# Patient Record
Sex: Male | Born: 1985 | Race: White | Hispanic: No | Marital: Single | Smoking: Former smoker
Health system: Southern US, Community
[De-identification: ages and names within clinical notes are randomized; demographics above are authoritative.]

## PROBLEM LIST (undated history)

## (undated) DIAGNOSIS — F419 Anxiety disorder, unspecified: Secondary | ICD-10-CM

## (undated) DIAGNOSIS — M7918 Myalgia, other site: Secondary | ICD-10-CM

## (undated) HISTORY — DX: Myalgia, other site: M79.18

## (undated) HISTORY — PX: NO PAST SURGERIES: SHX2092

---

## 2013-12-19 ENCOUNTER — Other Ambulatory Visit: Payer: Self-pay | Admitting: Occupational Medicine

## 2013-12-19 ENCOUNTER — Ambulatory Visit: Payer: Self-pay

## 2013-12-19 DIAGNOSIS — Z0000 Encounter for general adult medical examination without abnormal findings: Secondary | ICD-10-CM

## 2014-09-26 ENCOUNTER — Ambulatory Visit: Payer: Self-pay

## 2014-09-26 ENCOUNTER — Other Ambulatory Visit: Payer: Self-pay | Admitting: Occupational Medicine

## 2014-09-26 DIAGNOSIS — Z0000 Encounter for general adult medical examination without abnormal findings: Secondary | ICD-10-CM

## 2015-01-25 IMAGING — CR DG CHEST 2V
2 series · 2 of 2 positions shown · non-contrast
Comparison: 12/19/2013

CLINICAL DATA: Physical exam.

EXAM:
CHEST  2 VIEW

[view not recorded (1 of 2)]
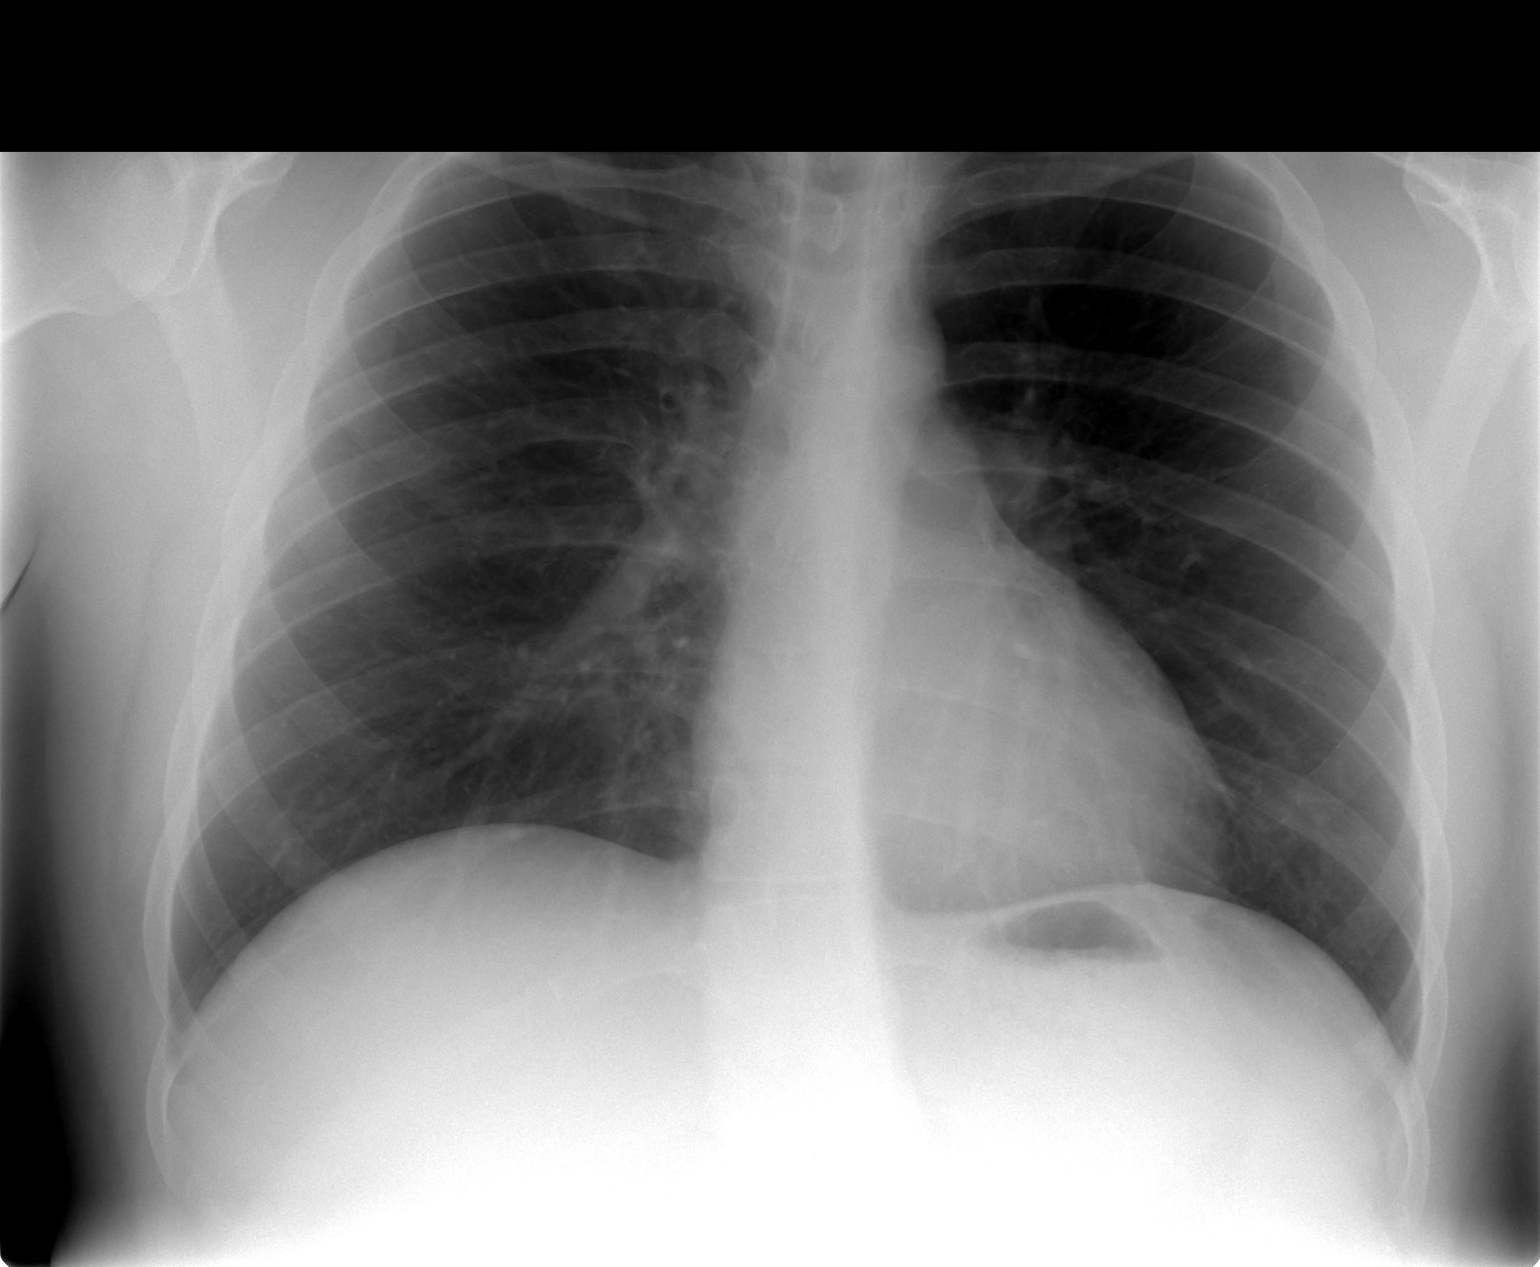

[view not recorded (2 of 2)]
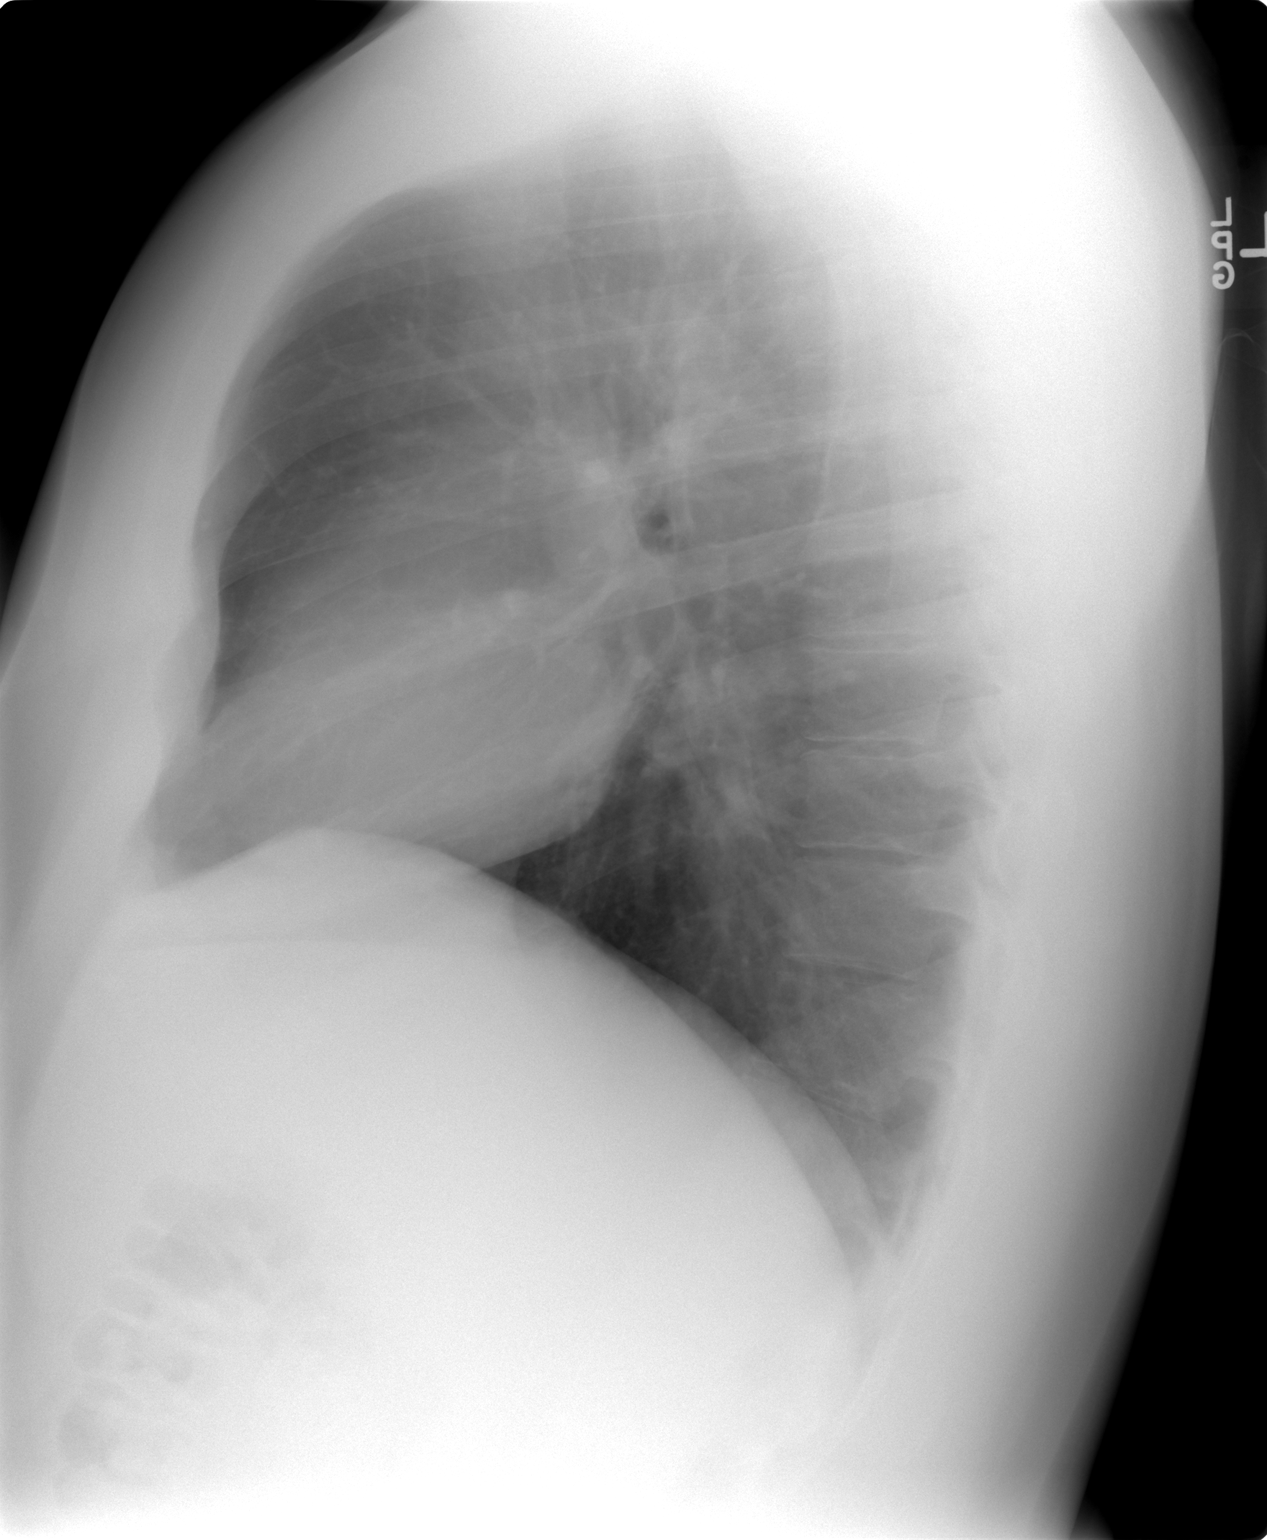

[2 of 2 positions shown; findings below may reference images not displayed]

FINDINGS: The heart size and mediastinal contours are within normal limits.
Both lungs are clear. The visualized skeletal structures are
unremarkable.
IMPRESSION: No active cardiopulmonary disease.

## 2016-12-06 ENCOUNTER — Ambulatory Visit (INDEPENDENT_AMBULATORY_CARE_PROVIDER_SITE_OTHER): Payer: 59 | Admitting: Family

## 2016-12-06 ENCOUNTER — Encounter: Payer: Self-pay | Admitting: Family

## 2016-12-06 ENCOUNTER — Other Ambulatory Visit: Payer: 59

## 2016-12-06 DIAGNOSIS — Z711 Person with feared health complaint in whom no diagnosis is made: Secondary | ICD-10-CM | POA: Diagnosis not present

## 2016-12-06 DIAGNOSIS — L918 Other hypertrophic disorders of the skin: Secondary | ICD-10-CM | POA: Diagnosis not present

## 2016-12-06 NOTE — Patient Instructions (Signed)
Thank you for choosing [facility].  SUMMARY AND INSTRUCTIONS:  Please continue to watch for signs of infection.  Cleanse with soap and water. No peroxide or alcohol.  Tylenol as needed for discomfort.   Labs:  Please stop by the lab on the lower level of the building for your blood work. Your results will be released to [place] (or called to you) after review, usually within 72 hours after test completion. If any changes need to be made, you will be notified at that same time.  1.) The lab is open from 7:30am to 5:30 pm Monday-Friday 2.) No appointment is necessary 3.) Fasting (if needed) is 6-8 hours after food and drink; black coffee and water are okay   Follow up:  If your symptoms worsen or fail to improve, please contact our office for further instruction, or in case of emergency go directly to the emergency room at the closest medical facility.    Skin Tag, Adult A skin tag (acrochordon) is a soft, extra growth of skin. Most skin tags are flesh-colored and rarely bigger than a pencil eraser. They commonly form near areas where there are folds in the skin, such as the armpit or groin. Skin tags are not dangerous, and they do not spread from person to person (are not contagious). You may have one skin tag or several. Skin tags do not require treatment. However, your health care provider may recommend removal of a skin tag if it:  Gets irritated from clothing.  Bleeds.  Is visible and unsightly. Your health care provider can remove skin tags with a simple surgical procedure or a procedure that involves freezing the skin tag. Follow these instructions at home:  Watch for any changes in your skin tag. A normal skin tag does not require any other special care at home.  Take over-the-counter and prescription medicines only as told by your health care provider.  Keep all follow-up visits as told by your health care provider. This is important. Contact a health care  provider if:  You have a skin tag that:  Becomes painful.  Changes color.  Bleeds.  Swells.  You develop more skin tags. This information is not intended to replace advice given to you by your health care provider. Make sure you discuss any questions you have with your health care provider. Document Released: 10/11/2015 Document Revised: 05/22/2016 Document Reviewed: 10/11/2015 Elsevier Interactive Patient Education  2017 [employer].

## 2016-12-06 NOTE — Assessment & Plan Note (Signed)
Multiple skin tags removed without complication. Continue with basic wound care and monitor for signs of infection. Follow up as needed.

## 2016-12-06 NOTE — Progress Notes (Signed)
Subjective:    Patient ID: Justin Andrade, male    DOB: [birth year], 31 y.o.   MRN: [id number]  Chief Complaint  Patient presents with  . Establish Care    skin tag removal if possible, wants STD screening    HPI:  Justin Andrade is a 31 y.o. male who  has no past medical history on file. and presents today for an office visit to establish care.   1.) Skin tag - This is a new problem. Associated symptom of a skin tag located on his back and left buttocks that have been going on for a couple of years. Denies any changes in size or shape. Described as annoying. Nontender with no discharge. Denies any modifying factors or attempted treatments. Would like to have them removed.   2.) Concern for STD: Requesting to be screened for STD. Does have new relationship with little concern for exposure. No current symptoms of discharge, dysuria, or rashes.   Allergies  Allergen Reactions  . Sulfa Antibiotics     Had an allergic reaction as a child. Does not remeber      No outpatient prescriptions prior to visit.   No facility-administered medications prior to visit.       History reviewed. No pertinent surgical history.    History reviewed. No pertinent past medical history.    Review of Systems  Constitutional: Negative for chills and fever.  Respiratory: Negative for chest tightness and shortness of breath.   Genitourinary: Negative for discharge, dysuria, frequency, hematuria, penile pain, penile swelling, scrotal swelling, testicular pain and urgency.  Skin:       Positive for skin tags.       Objective:    BP 134/90 (BP Location: Left Arm, Patient Position: Sitting, Cuff Size: Large)   Pulse 64   Temp 98.3 F (36.8 C) (Oral)   Resp 14   Ht 6\' 1"  (1.854 m)   Wt 227 lb (103 kg)   SpO2 98%   BMI 29.95 kg/m  Nursing note and vital signs reviewed.  Physical Exam  Constitutional: He is oriented to person, place, and time. He appears well-developed and well-nourished.  No distress.  Cardiovascular: Normal rate, regular rhythm, normal heart sounds and intact distal pulses.   Pulmonary/Chest: Effort normal and breath sounds normal.  Neurological: He is alert and oriented to person, place, and time.  Skin: Skin is warm and dry.  Two skin tags noted - one on the left side of his mid back and the other on the left side of his left buttock.   Psychiatric: He has a normal mood and affect. His behavior is normal. Judgment and thought content normal.   Procedure: Removal of skin tags  Risks and alternative treatments discussed. Patient provided verbal consent. The sites were identified and marked. Each site was independently cleansed with betadine and allowed to dry. Skin anesthesia was applied using a cold spray. Site was anesthetized with 2% lidocaine. Each skin tag was elevated with forceps and then lanced at the base using a scalpel. Site was cleansed with normal saline with bleeding well controlled with dressing and bandage. Procedure was uncomplicated and tolerated well. Post-care instructions were provided.     Assessment & Plan:   Problem List Items Addressed This Visit      Musculoskeletal and Integument   Skin tag    Multiple skin tags removed without complication. Continue with basic wound care and monitor for signs of infection. Follow up as needed.  Other   Concern about STD in male without diagnosis    Not currently experiencing symptoms. Obtain RPR, HSV 2, HSV 1, HIV and GC/chlamydia probe. Encouraged good sexual protection and practices. Follow up and treatment pending lab results as needed.       Relevant Orders   HIV antibody   HSV 1 antibody, IgG   HSV 2 antibody, IgG   RPR   GC/chlamydia probe amp, urine      Mr. Jansma does not currently have medications on file.   Follow-up: Return if symptoms worsen or fail to improve.  Mauricio Po, FNP

## 2016-12-06 NOTE — Assessment & Plan Note (Signed)
Not currently experiencing symptoms. Obtain RPR, HSV 2, HSV 1, HIV and GC/chlamydia probe. Encouraged good sexual protection and practices. Follow up and treatment pending lab results as needed.

## 2016-12-07 LAB — HSV 2 ANTIBODY, IGG

## 2016-12-07 LAB — GC/CHLAMYDIA PROBE AMP
CT PROBE, AMP APTIMA: NOT DETECTED
GC PROBE AMP APTIMA: NOT DETECTED

## 2016-12-07 LAB — HIV ANTIBODY (ROUTINE TESTING W REFLEX): HIV 1&2 Ab, 4th Generation: NONREACTIVE

## 2016-12-07 LAB — RPR

## 2016-12-07 LAB — HSV 1 ANTIBODY, IGG

## 2017-03-03 ENCOUNTER — Ambulatory Visit (INDEPENDENT_AMBULATORY_CARE_PROVIDER_SITE_OTHER): Payer: 59 | Admitting: Internal Medicine

## 2017-03-03 ENCOUNTER — Encounter: Payer: Self-pay | Admitting: Internal Medicine

## 2017-03-03 VITALS — BP 114/84 | HR 71 | Temp 97.7°F | Resp 16 | Wt 226.0 lb

## 2017-03-03 DIAGNOSIS — J029 Acute pharyngitis, unspecified: Secondary | ICD-10-CM | POA: Diagnosis not present

## 2017-03-03 LAB — POCT RAPID STREP A (OFFICE): RAPID STREP A SCREEN: NEGATIVE

## 2017-03-03 NOTE — Progress Notes (Signed)
    Subjective:    Patient ID: Justin Andrade, male    DOB: 1985/12/01, 31 y.o.   MRN: [id number]  HPI He is here for an acute visit for cold symptoms.  His symptoms started yesterday.  He denies sick contacts.  He had strep a few years ago.   He is experiencing sore throat and runny nose.  He has had mild ear pain/pressure and think his ears may be clogged.  He denies fever, chills, congestion, sinus pain, cough, sob, abdominal symptoms, headaches and muscle pain.  He has tried taking advil.     Medications and allergies reviewed with patient and updated if appropriate.  Patient Active Problem List   Diagnosis Date Noted  . Concern about STD in male without diagnosis 12/06/2016  . Skin tag 12/06/2016    No current outpatient prescriptions on file prior to visit.   No current facility-administered medications on file prior to visit.     No past medical history on file.  No past surgical history on file.  Social History   Social History  . Marital status: Single    Spouse name: N/A  . Number of children: 0  . Years of education: 62   Occupational History  . [employer]    Social History Main Topics  . Smoking status: Former Smoker    Packs/day: 0.50    Years: 5.00    Types: Cigarettes  . Smokeless tobacco: Never Used  . Alcohol use 2.4 oz/week    4 Cans of beer per week  . Drug use: No  . Sexual activity: Not on file   Other Topics Concern  . Not on file   Social History Narrative   Fun/Hobby: Dodgeball, treadmill, play video games    Family History  Problem Relation Age of Onset  . Hypertension Mother   . Depression Father     Review of Systems  Constitutional: Negative for appetite change, chills and fever.  HENT: Positive for ear pain (mild, ? clogged), rhinorrhea and sore throat. Negative for congestion, sinus pain and sinus pressure.   Respiratory: Negative for cough, shortness of breath and wheezing.   Gastrointestinal: Negative for abdominal  pain, diarrhea and nausea.  Musculoskeletal: Negative for myalgias.  Neurological: Negative for dizziness, light-headedness and headaches.       Objective:   Vitals:   03/03/17 1014  BP: 114/84  Pulse: 71  Resp: 16  Temp: 97.7 F (36.5 C)   Filed Weights   03/03/17 1014  Weight: 226 lb (102.5 kg)   Body mass index is 29.82 kg/m.  Wt Readings from Last 3 Encounters:  03/03/17 226 lb (102.5 kg)  12/06/16 227 lb (103 kg)     Physical Exam GENERAL APPEARANCE: Appears stated age, well appearing, NAD EYES: conjunctiva clear, no icterus HEENT: bilateral tympanic membranes and ear canals normal, oropharynx with moderate erythema, no thyromegaly, trachea midline, no cervical or supraclavicular lymphadenopathy LUNGS: Clear to auscultation without wheeze or crackles, unlabored breathing, good air entry bilaterally HEART: Normal S1,S2 without murmurs EXTREMITIES: Without clubbing, cyanosis, or edema        Assessment & Plan:   See Problem List for Assessment and Plan of chronic medical problems.

## 2017-03-03 NOTE — Assessment & Plan Note (Signed)
Rapid strep negative - likely viral Advil, rest, fluids Chloraseptic, cough drops  Call if no improvement

## 2017-03-03 NOTE — Addendum Note (Signed)
Addended by: Terence Lux B on: 03/03/2017 10:45 AM   Modules accepted: Orders

## 2017-03-03 NOTE — Patient Instructions (Addendum)
Your strep test is negative  Continue advil, rest, fluids.  Use cough drops or chloraseptic spray for your throat.   Pharyngitis Pharyngitis is redness, pain, and swelling (inflammation) of your pharynx. What are the causes? Pharyngitis is usually caused by infection. Most of the time, these infections are from viruses (viral) and are part of a cold. However, sometimes pharyngitis is caused by bacteria (bacterial). Pharyngitis can also be caused by allergies. Viral pharyngitis may be spread from person to person by coughing, sneezing, and personal items or utensils (cups, forks, spoons, toothbrushes). Bacterial pharyngitis may be spread from person to person by more intimate contact, such as kissing. What are the signs or symptoms? Symptoms of pharyngitis include:  Sore throat.  Tiredness (fatigue).  Low-grade fever.  Headache.  Joint pain and muscle aches.  Skin rashes.  Swollen lymph nodes.  Plaque-like film on throat or tonsils (often seen with bacterial pharyngitis). How is this diagnosed? Your health care provider will ask you questions about your illness and your symptoms. Your medical history, along with a physical exam, is often all that is needed to diagnose pharyngitis. Sometimes, a rapid strep test is done. Other lab tests may also be done, depending on the suspected cause. How is this treated? Viral pharyngitis will usually get better in 3-4 days without the use of medicine. Bacterial pharyngitis is treated with medicines that kill germs (antibiotics). Follow these instructions at home:  Drink enough water and fluids to keep your urine clear or pale yellow.  Only take over-the-counter or prescription medicines as directed by your health care provider:  If you are prescribed antibiotics, make sure you finish them even if you start to feel better.  Do not take aspirin.  Get lots of rest.  Gargle with 8 oz of salt water ( tsp of salt per 1 qt of water) as often as  every 1-2 hours to soothe your throat.  Throat lozenges (if you are not at risk for choking) or sprays may be used to soothe your throat. Contact a health care provider if:  You have large, tender lumps in your neck.  You have a rash.  You cough up green, yellow-brown, or bloody spit. Get help right away if:  Your neck becomes stiff.  You drool or are unable to swallow liquids.  You vomit or are unable to keep medicines or liquids down.  You have severe pain that does not go away with the use of recommended medicines.  You have trouble breathing (not caused by a stuffy nose). This information is not intended to replace advice given to you by your health care provider. Make sure you discuss any questions you have with your health care provider. Document Released: 09/26/2005 Document Revised: 03/03/2016 Document Reviewed: 06/03/2013 Elsevier Interactive Patient Education  2017 [employer].

## 2017-04-06 ENCOUNTER — Ambulatory Visit (INDEPENDENT_AMBULATORY_CARE_PROVIDER_SITE_OTHER): Payer: 59 | Admitting: Internal Medicine

## 2017-04-06 ENCOUNTER — Ambulatory Visit: Payer: 59 | Admitting: Family

## 2017-04-06 ENCOUNTER — Encounter: Payer: Self-pay | Admitting: Internal Medicine

## 2017-04-06 VITALS — BP 132/90 | HR 64 | Ht 73.0 in | Wt 226.0 lb

## 2017-04-06 DIAGNOSIS — M25511 Pain in right shoulder: Secondary | ICD-10-CM | POA: Diagnosis not present

## 2017-04-06 DIAGNOSIS — G8929 Other chronic pain: Secondary | ICD-10-CM | POA: Diagnosis not present

## 2017-04-06 MED ORDER — TRAMADOL HCL 50 MG PO TABS: 50.0000 mg | ORAL_TABLET | Freq: Two times a day (BID) | ORAL | 0 refills | Status: DC | PRN

## 2017-04-06 NOTE — Progress Notes (Signed)
   Subjective:    Patient ID: Justin Andrade, male    DOB: December 25, 1985, 31 y.o.   MRN: [id number]  HPI  Here to f/u with c/o onset x 2 mo right shoulder pain and decreased ROM, feels he may have partial tear again of the rot cuff that he has had 10 yrs ago with playing baseball.  Then was improved with PT, no surgury needed.  No recent overuse, trauma or heavy lifing but feels similar, without neck pain or other more distal arm pain, swelling or rash.  Asks for pain med for sleep as cannot sleep well in the past week No past medical history on file. No past surgical history on file.  reports that he has quit smoking. His smoking use included Cigarettes. He has a 2.50 pack-year smoking history. He has never used smokeless tobacco. He reports that he drinks about 2.4 oz of alcohol per week . He reports that he does not use drugs. family history includes Depression in his father; Hypertension in his mother. Allergies  Allergen Reactions  . Sulfa Antibiotics     Had an allergic reaction as a child. Does not remeber   No current outpatient prescriptions on file prior to visit.   No current facility-administered medications on file prior to visit.    Review of Systems  Constitutional: Negative for other unusual diaphoresis or sweats HENT: Negative for ear discharge or swelling Eyes: Negative for other worsening visual disturbances Respiratory: Negative for stridor or other swelling  Gastrointestinal: Negative for worsening distension or other blood Genitourinary: Negative for retention or other urinary change Musculoskeletal: Negative for other MSK pain or swelling Skin: Negative for color change or other new lesions Neurological: Negative for worsening tremors and other numbness  Psychiatric/Behavioral: Negative for worsening agitation or other fatigue All other system neg per pt    Objective:   Physical Exam BP 132/90   Pulse 64   Ht 6\' 1"  (1.854 m)   Wt 226 lb (102.5 kg)   SpO2 99%    BMI 29.82 kg/m  VS noted, not ill appearing Constitutional: Pt appears in NAD HENT: Head: NCAT.  Right Ear: External ear normal.  Left Ear: External ear normal.  Eyes: . Pupils are equal, round, and reactive to light. Conjunctivae and EOM are normal Nose: without d/c or deformity Neck: Neck supple. Gross normal ROM Cardiovascular: Normal rate and regular rhythm.   Pulmonary/Chest: Effort normal and breath sounds without rales or wheezing.  Right shoulder NT but pain and decraased ROM to forward elevation and abduction to just over 90 degrees Neurological: Pt is alert. At baseline orientation, motor 5/5 intact Skin: Skin is warm. No rashes, other new lesions, no LE edema Psychiatric: Pt behavior is normal without agitation  No other exam findings     Assessment & Plan:

## 2017-04-06 NOTE — Patient Instructions (Signed)
Please take all new medication as prescribed - the pain medication as needed  Please continue all other medications as before, and refills have been done if requested.  Please have the pharmacy call with any other refills you may need.  Please keep your appointments with your specialists as you may have planned  You will be contacted regarding the referral for: Dr Smith/sports medicine in this office, (though you can make an appt as you leave at the checkout desk today)

## 2017-04-09 NOTE — Assessment & Plan Note (Signed)
I suspect pt may be correct regarding the recurrence right shoulder at least partial rot cuff injury; for pain med today as nsaid have not helped, and Refer Sports Med in this office for further consideration

## 2017-05-01 NOTE — Progress Notes (Signed)
[facility] [address] [place], [place] [address] Phone: [phone] Subjective:    I'm seeing this patient by the request  of:  Golden Circle, FNP   CC: Right shoulder pain   EXN:[id number]  Justin Andrade is a 31 y.o. male coming in with complaint of Right shoulder pain. History of a dislocation when he was 19. Unfortunately had another 5-6 months ago had what appeared to be another dislocation.  Signs surgery. Patient states that with his last injury has noticed that is having some decreasing range of motion. Sometimes feels like his shoulder can come out of place. Pain at night. Denies any radiation down the arm. Describes most the pain as an aching sensation. Aggravated once again by overhead movements as well as certain positioning. Has done icing and over-the-counter medication with very minimal benefit. Rates the severity is 6 out of 10.     No past medical history on file. No past surgical history on file. Social History   Social History  . Marital status: Single    Spouse name: N/A  . Number of children: 0  . Years of education: 73   Occupational History  . [employer]    Social History Main Topics  . Smoking status: Former Smoker    Packs/day: 0.50    Years: 5.00    Types: Cigarettes  . Smokeless tobacco: Never Used  . Alcohol use 2.4 oz/week    4 Cans of beer per week  . Drug use: No  . Sexual activity: Not Asked   Other Topics Concern  . None   Social History Narrative   Fun/Hobby: Dodgeball, treadmill, play video games   Allergies  Allergen Reactions  . Sulfa Antibiotics     Had an allergic reaction as a child. Does not remeber   Family History  Problem Relation Age of Onset  . Hypertension Mother   . Depression Father      Past medical history, social, surgical and family history all reviewed in electronic medical record.  No pertanent information unless stated regarding to the chief complaint.   Review of  Systems:Review of systems updated and as accurate as of 05/02/17  No headache, visual changes, nausea, vomiting, diarrhea, constipation, dizziness, abdominal pain, skin rash, fevers, chills, night sweats, weight loss, swollen lymph nodes, body aches, joint swelling, muscle aches, chest pain, shortness of breath, mood changes.   Objective  Blood pressure 110/76, pulse 65, height 6\' 1"  (1.854 m), weight 224 lb (101.6 kg), SpO2 97 %. Systems examined below as of 05/02/17   General: No apparent distress alert and oriented x3 mood and affect normal, dressed appropriately.  HEENT: Pupils equal, extraocular movements intact  Respiratory: Patient's speak in full sentences and does not appear short of breath  Cardiovascular: No lower extremity edema, non tender, no erythema  Skin: Warm dry intact with no signs of infection or rash on extremities or on axial skeleton.  Abdomen: Soft nontender  Neuro: Cranial nerves II through XII are intact, neurovascularly intact in all extremities with 2+ DTRs and 2+ pulses.  Lymph: No lymphadenopathy of posterior or anterior cervical chain or axillae bilaterally.  Gait normal with good balance and coordination.  MSK:  Non tender with full range of motion and good stability and symmetric strength and tone of , elbows, wrist, hip, knee and ankles bilaterally.  Shoulder: Right Inspection reveals no abnormalities, atrophy or asymmetry. Palpation is normal with no tenderness over AC joint or bicipital  groove. ROM is full in all planes passively. Rotator cuff strength normal throughout. signs of impingement with positive Neer and Hawkin's tests, but negative empty can sign. Speeds and Yergason's tests normal. No labral pathology noted with negative Obrien's, negative clunk and good stability. Normal scapular function observed. No painful arc and no drop arm sign. Positive apprehension sign and does have some mild laxity  MSK US performed of: Right This study was  ordered, performed, and interpreted by Charlann Boxer D.O.  Shoulder:   Supraspinatus:  Appears normal on long and transverse views, Bursal bulge seen with shoulder abduction on impingement view. Infraspinatus:  Appears normal on long and transverse views. Significant increase in Doppler flow Subscapularis:  Appears normal on long and transverse views. Positive bursa Teres Minor:  Appears normal on long and transverse views. AC joint:  Capsule undistended, no geyser sign. Glenohumeral Joint:  Appears normal without effusion. Glenoid Labrum:  Intact without visualized tears. Biceps Tendon:  Appears normal on long and transverse views, no fraying of tendon, tendon located in intertubercular groove, no subluxation with shoulder internal or external rotation.  Impression: Subacromial bursitis  fairly unremarkable    Impression and Recommendations:     This case required medical decision making of moderate complexity.      Note: This dictation was prepared with Dragon dictation along with smaller phrase technology. Any transcriptional errors that result from this process are unintentional.

## 2017-05-02 ENCOUNTER — Ambulatory Visit: Payer: Self-pay

## 2017-05-02 ENCOUNTER — Ambulatory Visit (INDEPENDENT_AMBULATORY_CARE_PROVIDER_SITE_OTHER): Payer: 59 | Admitting: Family Medicine

## 2017-05-02 ENCOUNTER — Encounter: Payer: Self-pay | Admitting: Family Medicine

## 2017-05-02 VITALS — BP 110/76 | HR 65 | Ht 73.0 in | Wt 224.0 lb

## 2017-05-02 DIAGNOSIS — M7551 Bursitis of right shoulder: Secondary | ICD-10-CM

## 2017-05-02 DIAGNOSIS — Z8739 Personal history of other diseases of the musculoskeletal system and connective tissue: Secondary | ICD-10-CM | POA: Diagnosis not present

## 2017-05-02 DIAGNOSIS — M25511 Pain in right shoulder: Secondary | ICD-10-CM | POA: Diagnosis not present

## 2017-05-02 MED ORDER — DICLOFENAC SODIUM 2 % TD SOLN: 2.0000 "application " | Freq: Two times a day (BID) | TRANSDERMAL | 3 refills | Status: DC

## 2017-05-02 MED ORDER — VITAMIN D (ERGOCALCIFEROL) 1.25 MG (50000 UNIT) PO CAPS: 50000.0000 [IU] | ORAL_CAPSULE | ORAL | 0 refills | Status: DC

## 2017-05-02 NOTE — Assessment & Plan Note (Signed)
Patient does have some shulder bursitis.Dicusse home execises, icing regimn, topicaanti-inflammaory's prescribe. Onc weekl viamin D for strenth an enduance Discussed with paient abou advance imaging if necessary in the long run with patient having a history of dislocations. Patient will come back and see me again in 4 weeks after conservative therapy.

## 2017-05-02 NOTE — Patient Instructions (Addendum)
Good to see you.  Ice 20 minutes 2 times daily. Usually after activity and before bed. Exercises 3 times a week.  pennsaid pinkie amount topically 2 times daily as needed.  Keep hands within peripheral vision  Once weekly vitamin D for 12 weeks See me again in 4-6 weeks to make sure doing well.

## 2017-06-23 ENCOUNTER — Ambulatory Visit (INDEPENDENT_AMBULATORY_CARE_PROVIDER_SITE_OTHER): Payer: 59 | Admitting: Family

## 2017-06-23 ENCOUNTER — Encounter: Payer: Self-pay | Admitting: Family

## 2017-06-23 VITALS — BP 112/76 | HR 73 | Temp 98.7°F | Resp 16 | Ht 73.0 in | Wt 229.0 lb

## 2017-06-23 DIAGNOSIS — J029 Acute pharyngitis, unspecified: Secondary | ICD-10-CM | POA: Diagnosis not present

## 2017-06-23 MED ORDER — PREDNISONE 5 MG (21) PO TBPK: ORAL_TABLET | ORAL | 0 refills | Status: DC

## 2017-06-23 MED ORDER — AMOXICILLIN-POT CLAVULANATE 875-125 MG PO TABS: 1.0000 | ORAL_TABLET | Freq: Two times a day (BID) | ORAL | 0 refills | Status: DC

## 2017-06-23 NOTE — Progress Notes (Signed)
Subjective:    Patient ID: Justin Andrade, male    DOB: 1986/02/18, 31 y.o.   MRN: [id number]  Chief Complaint  Patient presents with  . Sore Throat    starting last monday, sinus drainage, sore throat, thinks there is a growth in the back of his throat that is causing pain    HPI:  Justin Andrade is a 31 y.o. male who  has no past medical history on file. and presents today for an acute office visit.  This is a new problem. Associated symptoms of sore throat and sinus draining has been going on for about 2 weeks. Overall symptoms have improved since initial onset. No fevers. Describes feeling like he has a growth in his throat that is resulting in some pain/discomfort. Modifying factors include Advil which has helped a little.    Allergies  Allergen Reactions  . Sulfa Antibiotics     Had an allergic reaction as a child. Does not remeber      Outpatient Medications Prior to Visit  Medication Sig Dispense Refill  . Diclofenac Sodium (PENNSAID) 2 % SOLN Place 2 application onto the skin 2 (two) times daily. 112 g 3  . traMADol (ULTRAM) 50 MG tablet Take 1 tablet (50 mg total) by mouth every 12 (twelve) hours as needed. 60 tablet 0  . Vitamin D, Ergocalciferol, (DRISDOL) 50000 units CAPS capsule Take 1 capsule (50,000 Units total) by mouth every 7 (seven) days. 12 capsule 0   No facility-administered medications prior to visit.       No past surgical history on file.    No past medical history on file.    Review of Systems  Constitutional: Negative for chills and fever.  HENT: Positive for sore throat. Negative for congestion, ear discharge, ear pain, facial swelling, sinus pain, sinus pressure and voice change.   Respiratory: Negative for chest tightness and shortness of breath.       Objective:    BP 112/76 (BP Location: Left Arm, Patient Position: Sitting, Cuff Size: Large)   Pulse 73   Temp 98.7 F (37.1 C) (Oral)   Resp 16   Ht 6\' 1"  (1.854 m)   Wt 229 lb  (103.9 kg)   SpO2 97%   BMI 30.21 kg/m  Nursing note and vital signs reviewed.  Physical Exam  Constitutional: He is oriented to person, place, and time. He appears well-developed and well-nourished. No distress.  HENT:  Right Ear: Hearing, tympanic membrane, external ear and ear canal normal.  Left Ear: Hearing, tympanic membrane, external ear and ear canal normal.  Nose: Nose normal. Right sinus exhibits no maxillary sinus tenderness and no frontal sinus tenderness. Left sinus exhibits no maxillary sinus tenderness and no frontal sinus tenderness.  Mouth/Throat: Uvula is midline and mucous membranes are normal. No oropharyngeal exudate, posterior oropharyngeal edema, posterior oropharyngeal erythema or tonsillar abscesses.  Small amount of exudate noted on right tonsil which is 1+  Neck: Neck supple.  Cardiovascular: Normal rate, regular rhythm, normal heart sounds and intact distal pulses.   Pulmonary/Chest: Effort normal and breath sounds normal.  Lymphadenopathy:    He has cervical adenopathy.  Neurological: He is alert and oriented to person, place, and time.  Skin: Skin is warm and dry.  Psychiatric: He has a normal mood and affect. His behavior is normal. Judgment and thought content normal.       Assessment & Plan:   Problem List Items Addressed This Visit      Other  Sore throat - Primary    New onset pharyngitis appears with mild bacterial infection given exudate on tonsil and lymphadenopathy. Start Augmentin and prednisone taper. Continue OTC medications as needed for symptom relief and supportive care.           I am having Mr. Granade maintain his traMADol, Diclofenac Sodium, Vitamin D (Ergocalciferol), predniSONE, and amoxicillin-clavulanate.   Meds ordered this encounter  Medications  . DISCONTD: amoxicillin-clavulanate (AUGMENTIN) 875-125 MG tablet    Sig: Take 1 tablet by mouth 2 (two) times daily.    Dispense:  14 tablet    Refill:  0    Order Specific  Question:   Supervising Provider    Answer:   Pricilla Holm A [[id number]]  . DISCONTD: predniSONE (STERAPRED UNI-PAK 21 TAB) 5 MG (21) TBPK tablet    Sig: Take 6 tablets x 1 day, 5 tablets x 1 day, 4 tablets x 1 day, 3 tablets x 1 day, 2 tablets x 1 day, 1 tablet x 1 day    Dispense:  21 tablet    Refill:  0    Order Specific Question:   Supervising Provider    Answer:   Pricilla Holm A [[id number]]  . predniSONE (STERAPRED UNI-PAK 21 TAB) 5 MG (21) TBPK tablet    Sig: Take 6 tablets x 1 day, 5 tablets x 1 day, 4 tablets x 1 day, 3 tablets x 1 day, 2 tablets x 1 day, 1 tablet x 1 day    Dispense:  21 tablet    Refill:  0    Order Specific Question:   Supervising Provider    Answer:   Pricilla Holm A [[id number]]  . amoxicillin-clavulanate (AUGMENTIN) 875-125 MG tablet    Sig: Take 1 tablet by mouth 2 (two) times daily.    Dispense:  14 tablet    Refill:  0    Order Specific Question:   Supervising Provider    Answer:   Pricilla Holm A [[id number]]     Follow-up: Return if symptoms worsen or fail to improve.  Mauricio Po, FNP

## 2017-06-23 NOTE — Patient Instructions (Addendum)
Thank you for choosing [facility].  SUMMARY AND INSTRUCTIONS:  Start the Augmentin and prednisone.  If your symptoms worsen or do not improve please follow up.  Medication:  Your prescription(s) have been submitted to your pharmacy or been printed and provided for you. Please take as directed and contact our office if you believe you are having problem(s) with the medication(s) or have any questions.  Follow up:  If your symptoms worsen or fail to improve, please contact our office for further instruction, or in case of emergency go directly to the emergency room at the closest medical facility.    General Recommendations:    Please drink plenty of fluids.  Get plenty of rest   Sleep in humidified air  Use saline nasal sprays  Netti pot   OTC Medications:  Decongestants - helps relieve congestion   Flonase (generic fluticasone) or Nasacort (generic triamcinolone) - please make sure to use the "cross-over" technique at a 45 degree angle towards the opposite eye as opposed to straight up the nasal passageway.   Sudafed (generic pseudoephedrine - Note this is the one that is available behind the pharmacy counter); Products with phenylephrine (-PE) may also be used but is often not as effective as pseudoephedrine.   If you have HIGH BLOOD PRESSURE - Coricidin HBP; AVOID any product that is -D as this contains pseudoephedrine which may increase your blood pressure.  Afrin (oxymetazoline) every 6-8 hours for up to 3 days.   Allergies - helps relieve runny nose, itchy eyes and sneezing   Claritin (generic loratidine), Allegra (fexofenidine), or Zyrtec (generic cyrterizine) for runny nose. These medications should not cause drowsiness.  Note - Benadryl (generic diphenhydramine) may be used however may cause drowsiness  Cough -   Delsym or Robitussin (generic dextromethorphan)  Expectorants - helps loosen mucus to ease removal   Mucinex (generic guaifenesin)  as directed on the package.  Headaches / General Aches   Tylenol (generic acetaminophen) - DO NOT EXCEED 3 grams (3,000 mg) in a 24 hour time period  Advil/Motrin (generic ibuprofen)   Sore Throat -   Salt water gargle   Chloraseptic (generic benzocaine) spray or lozenges / Sucrets (generic dyclonine)

## 2017-06-23 NOTE — Assessment & Plan Note (Signed)
New onset pharyngitis appears with mild bacterial infection given exudate on tonsil and lymphadenopathy. Start Augmentin and prednisone taper. Continue OTC medications as needed for symptom relief and supportive care.

## 2017-07-05 ENCOUNTER — Telehealth: Payer: Self-pay | Admitting: Family

## 2017-07-05 DIAGNOSIS — J029 Acute pharyngitis, unspecified: Secondary | ICD-10-CM

## 2017-07-05 NOTE — Telephone Encounter (Signed)
The ultrasound order has been placed.

## 2017-07-05 NOTE — Telephone Encounter (Signed)
Pt called stating that he is still having a sore throat and was told to call back to get an ultra sound scheduled. Can a referral be put in for this as soon as possible?

## 2017-07-05 NOTE — Telephone Encounter (Signed)
Please advise 

## 2017-07-20 ENCOUNTER — Encounter: Payer: Self-pay | Admitting: Family

## 2017-08-21 DIAGNOSIS — D3122 Benign neoplasm of left retina: Secondary | ICD-10-CM | POA: Diagnosis not present

## 2017-08-21 DIAGNOSIS — D3121 Benign neoplasm of right retina: Secondary | ICD-10-CM | POA: Diagnosis not present

## 2018-01-19 ENCOUNTER — Encounter: Payer: Self-pay | Admitting: Family Medicine

## 2018-01-19 ENCOUNTER — Ambulatory Visit: Payer: 59 | Admitting: Family Medicine

## 2018-01-19 VITALS — BP 138/84 | HR 56 | Temp 98.6°F | Ht 73.0 in | Wt 228.0 lb

## 2018-01-19 DIAGNOSIS — M7918 Myalgia, other site: Secondary | ICD-10-CM | POA: Insufficient documentation

## 2018-01-19 MED ORDER — MELOXICAM 15 MG PO TABS: 15.0000 mg | ORAL_TABLET | Freq: Every day | ORAL | 0 refills | Status: DC

## 2018-01-19 NOTE — Patient Instructions (Addendum)
Please try the mobic for 10 days and then as needed.  Please try the exercises if your pain is less 2/10  You could also try Aspercreme with lidocaine. Please let me know if you don't have improvement and we can consider physical therapy or an injection  Please try to adjust your work space to put less stress on your neck.

## 2018-01-19 NOTE — Assessment & Plan Note (Signed)
Pain likely exacerbated by his work environment. Muscular in nature.  - mobic  - counseled on HEP  - if no improvement consider PT or trigger injection

## 2018-01-19 NOTE — Progress Notes (Signed)
Justin Andrade - 32 y.o. male MRN [id number]  Date of birth: [birth year]  SUBJECTIVE:  Including CC & ROS.  Chief Complaint  Patient presents with  . Neck Pain    Justin Andrade is a 32 y.o. male that is presenting with neck pain. Ongoing for one week. Pain is located at the base of his of his neck, in his trapezius and medial border of his right scapula. Pain is mild to severe when he rotates his neck. He has been taking motrin for the pain. Denies tingling or numbness. Denies injury or surgeries. He works at a desk all day.  He has a history of right shoulder pain. This seems to exacerbate his pain. He hasn't tried any specific modality on a regular basis. Has improvement with ibuprofen. He has pain worse with sleeping on his back.    Review of Systems  Constitutional: Negative for fever.  HENT: Negative for congestion.   Respiratory: Negative for cough.   Cardiovascular: Negative for chest pain.  Gastrointestinal: Negative for abdominal pain.  Musculoskeletal: Positive for back pain and neck pain.  Skin: Negative for color change.  Allergic/Immunologic: Negative for immunocompromised state.  Neurological: Negative for weakness.  Hematological: Negative for adenopathy.  Psychiatric/Behavioral: Negative for agitation.    HISTORY: Past Medical, Surgical, Social, and Family History Reviewed & Updated per EMR.   Pertinent Historical Findings include:  No past medical history on file.  No past surgical history on file.  Allergies  Allergen Reactions  . Sulfa Antibiotics     Had an allergic reaction as a child. Does not remeber    Family History  Problem Relation Age of Onset  . Hypertension Mother   . Depression Father      Social History   Socioeconomic History  . Marital status: Single    Spouse name: Not on file  . Number of children: 0  . Years of education: 103  . Highest education level: Not on file  Occupational History  . 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  Financial resource strain: Not on file  . Food insecurity:    Worry: Not on file    Inability: Not on file  . Transportation needs:    Medical: Not on file    Non-medical: Not on file  Tobacco Use  . Smoking status: Former Smoker    Packs/day: 0.50    Years: 5.00    Pack years: 2.50    Types: Cigarettes  . Smokeless tobacco: Never Used  Substance and Sexual Activity  . Alcohol use: Yes    Alcohol/week: 2.4 oz    Types: 4 Cans of beer per week  . Drug use: No  . Sexual activity: Not on file  Lifestyle  . Physical activity:    Days per week: Not on file    Minutes per session: Not on file  . Stress: Not on file  Relationships  . Social connections:    Talks on phone: Not on file    Gets together: Not on file    Attends religious service: Not on file    Active member of club or organization: Not on file    Attends meetings of clubs or organizations: Not on file    Relationship status: Not on file  . Intimate partner violence:    Fear of current or ex partner: Not on file    Emotionally abused: Not on file    Physically abused: Not on file    Forced sexual activity:  Not on file  Other Topics Concern  . Not on file  Social History Narrative   Fun/Hobby: Dodgeball, treadmill, play video games     PHYSICAL EXAM:  VS: BP 138/84 (BP Location: Left Arm, Patient Position: Sitting, Cuff Size: Normal)   Pulse (!) 56   Temp 98.6 F (37 C) (Oral)   Ht 6\' 1"  (1.854 m)   Wt 228 lb (103.4 kg)   SpO2 98%   BMI 30.08 kg/m  Physical Exam Gen: NAD, alert, cooperative with exam, well-appearing ENT: normal lips, normal nasal mucosa,  Eye: normal EOM, normal conjunctiva and lids CV:  no edema, +2 pedal pulses   Resp: no accessory muscle use, non-labored,  Skin: no rashes, no areas of induration  Neuro: normal tone, normal sensation to touch Psych:  normal insight, alert and oriented MSK:  Neck:  No TTP of the midline spine or neck muscles. Holding head stiff upright Normal  neck range of motion Back: Tenderness to palpation over the right rhomboid. Normal strength resistance with shrug. No winging of the scapula Normal shoulder range of motion. Normal internal and external rotation strength resistance. Normal external rotation. Neurovascularly intact      ASSESSMENT & PLAN:   Myofascial pain Pain likely exacerbated by his work environment. Muscular in nature.  - mobic  - counseled on HEP  - if no improvement consider PT or trigger injection

## 2018-01-29 ENCOUNTER — Encounter: Payer: Self-pay | Admitting: Family Medicine

## 2018-01-29 ENCOUNTER — Ambulatory Visit: Payer: 59 | Admitting: Family Medicine

## 2018-01-29 VITALS — BP 140/78 | HR 67 | Ht 74.0 in | Wt 227.2 lb

## 2018-01-29 DIAGNOSIS — Z0000 Encounter for general adult medical examination without abnormal findings: Secondary | ICD-10-CM | POA: Diagnosis not present

## 2018-01-29 DIAGNOSIS — Z13220 Encounter for screening for lipoid disorders: Secondary | ICD-10-CM

## 2018-01-29 LAB — LIPID PANEL
CHOLESTEROL: 157 mg/dL (ref 0–200)
HDL: 41.7 mg/dL (ref >39.00)
NonHDL: 115.41
Total CHOL/HDL Ratio: 4
Triglycerides: 216 mg/dL — ABNORMAL HIGH (ref 0.0–149.0)
VLDL: 43.2 mg/dL — AB (ref 0.0–40.0)

## 2018-01-29 LAB — BASIC METABOLIC PANEL
BUN: 17 mg/dL (ref 6–23)
CALCIUM: 9.9 mg/dL (ref 8.4–10.5)
CO2: 21 mEq/L (ref 19–32)
Chloride: 105 mEq/L (ref 96–112)
Creatinine, Ser: 0.78 mg/dL (ref 0.40–1.50)
GFR: 121.03 mL/min (ref >60.00)
Glucose, Bld: 90 mg/dL (ref 70–99)
POTASSIUM: 3.9 meq/L (ref 3.5–5.1)
SODIUM: 140 meq/L (ref 135–145)

## 2018-01-29 LAB — LDL CHOLESTEROL, DIRECT: Direct LDL: 97 mg/dL

## 2018-01-29 LAB — TSH: TSH: 1.97 u[IU]/mL (ref 0.35–4.50)

## 2018-01-29 NOTE — Patient Instructions (Signed)

## 2018-01-29 NOTE — Progress Notes (Signed)
Justin Andrade - 32 y.o. male MRN [id number]  Date of birth: 03/31/1986  Subjective Chief Complaint  Patient presents with  . Annual Exam    HPI Justin Andrade is a relatively healthy 32 y.o. male here today for annual exam.  He would like to have appropriate lab work updated today.  He denies any concerns at this time but is being followed by sports medicine for myofascial pain.   This still persists but he has not tried prescribed medications.  He tells me that he does exercise intermittently on the treadmill and follows a fairly healthy diet.  He has a dentist that he sees regularly.     Allergies  Allergen Reactions  . Sulfa Antibiotics     Had an allergic reaction as a child. Does not remeber    Past Medical History:  Diagnosis Date  . Myofascial pain     History reviewed. No pertinent surgical history.  Social History   Socioeconomic History  . Marital status: Single    Spouse name: Not on file  . Number of children: 0  . Years of education: 63  . Highest education level: Not on file  Occupational History  . 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 Financial resource strain: Not on file  . Food insecurity:    Worry: Not on file    Inability: Not on file  . Transportation needs:    Medical: Not on file    Non-medical: Not on file  Tobacco Use  . Smoking status: Former Smoker    Packs/day: 0.50    Years: 5.00    Pack years: 2.50    Types: Cigarettes    Last attempt to quit: 01/30/2011    Years since quitting: 7.0  . Smokeless tobacco: Never Used  Substance and Sexual Activity  . Alcohol use: Yes    Alcohol/week: 2.4 oz    Types: 4 Cans of beer per week  . Drug use: No  . Sexual activity: Not on file  Lifestyle  . Physical activity:    Days per week: Not on file    Minutes per session: Not on file  . Stress: Not on file  Relationships  . Social connections:    Talks on phone: Not on file    Gets together: Not on file    Attends religious service: Not on  file    Active member of club or organization: Not on file    Attends meetings of clubs or organizations: Not on file    Relationship status: Not on file  Other Topics Concern  . 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 Hypertension Mother   . Depression Father     Health Maintenance  Topic Date Due  . TETANUS/TDAP  12/27/2002  . INFLUENZA VACCINE  05/10/2018  . HIV Screening  Completed   Review of Systems  Constitutional: Negative for chills, fever and weight loss.  HENT: Negative for congestion, ear pain, hearing loss and sore throat.   Eyes: Negative for blurred vision and pain.  Respiratory: Negative for cough, hemoptysis, sputum production and shortness of breath.   Cardiovascular: Negative for chest pain and palpitations.  Gastrointestinal: Negative for abdominal pain, blood in stool, constipation, diarrhea, heartburn, nausea and vomiting.  Genitourinary: Negative for dysuria, frequency, hematuria and urgency.  Musculoskeletal: Positive for myalgias and neck  pain. Negative for joint pain.  Skin: Negative for rash.  Neurological: Negative for dizziness and headaches.  Endo/Heme/Allergies: Negative for environmental allergies. Does not bruise/bleed easily.  Psychiatric/Behavioral: Negative for depression. The patient is not nervous/anxious.     ----------------------------------------------------------------------------------------------------------------------------------------------------- Physical Exam BP 140/78 (BP Location: Right Arm, Patient Position: Sitting, Cuff Size: Normal)   Pulse 67   Ht 6\' 2"  (1.88 m)   Wt 227 lb 3.2 oz (103.1 kg)   BMI 29.17 kg/m   Physical Exam  Constitutional: He is oriented to person, place, and time. He appears well-nourished. No distress.  HENT:  Head: Normocephalic and atraumatic.  Mouth/Throat: Oropharynx  is clear and moist.  Eyes: Conjunctivae and EOM are normal. No scleral icterus.  Neck: Neck supple. No thyromegaly present.  Cardiovascular: Normal rate, regular rhythm, normal heart sounds and intact distal pulses.  Pulmonary/Chest: Effort normal and breath sounds normal. No respiratory distress.  Abdominal: Soft. Bowel sounds are normal. He exhibits no distension and no mass. There is no tenderness. There is no guarding. Hernia confirmed negative in the right inguinal area and confirmed negative in the left inguinal area.  Genitourinary: Testes normal and penis normal.  Musculoskeletal: He exhibits no edema.  Lymphadenopathy:    He has no cervical adenopathy.  Neurological: He is alert and oriented to person, place, and time.  Skin: No rash noted.  Psychiatric: He has a normal mood and affect. His behavior is normal.    ------------------------------------------------------------------------------------------------------------------------------------------------------------------------------------------------------------------- Assessment and Plan  Encounter for well adult exam without abnormal findings Well adult, labs per orders today. -Anticipatory guidance:  Counseled on safety including seatbelt use, avoidance of dangerous activities and safe sex practices.  Has dentist-encouraged continued regular visits.  Counseled on avoidance of excess alcohol and regular use of sunscreen. -Risk factor reduction:  Encouraged to keep up regular exercise habits.  Follow a diet with liberal amounts of fruits and vegetables and reduced red meat intake.  -Immunizations: up to date

## 2018-01-29 NOTE — Assessment & Plan Note (Signed)
Well adult, labs per orders today. -Anticipatory guidance:  Counseled on safety including seatbelt use, avoidance of dangerous activities and safe sex practices.  Has dentist-encouraged continued regular visits.  Counseled on avoidance of excess alcohol and regular use of sunscreen. -Risk factor reduction:  Encouraged to keep up regular exercise habits.  Follow a diet with liberal amounts of fruits and vegetables and reduced red meat intake.  -Immunizations: up to date

## 2018-01-29 NOTE — Addendum Note (Signed)
Addended by: Lynnea Ferrier on: 01/29/2018 02:02 PM   Modules accepted: Orders

## 2018-02-02 ENCOUNTER — Other Ambulatory Visit (INDEPENDENT_AMBULATORY_CARE_PROVIDER_SITE_OTHER): Payer: 59

## 2018-02-02 DIAGNOSIS — Z13220 Encounter for screening for lipoid disorders: Secondary | ICD-10-CM | POA: Diagnosis not present

## 2018-02-02 DIAGNOSIS — Z0000 Encounter for general adult medical examination without abnormal findings: Secondary | ICD-10-CM | POA: Diagnosis not present

## 2018-02-02 LAB — CBC
HCT: 45.6 % (ref 39.0–52.0)
Hemoglobin: 15.6 g/dL (ref 13.0–17.0)
MCHC: 34.1 g/dL (ref 30.0–36.0)
MCV: 88.4 fl (ref 78.0–100.0)
Platelets: 253 10*3/uL (ref 150.0–400.0)
RBC: 5.16 Mil/uL (ref 4.22–5.81)
RDW: 12.7 % (ref 11.5–15.5)
WBC: 8.9 10*3/uL (ref 4.0–10.5)

## 2018-03-14 ENCOUNTER — Ambulatory Visit: Payer: 59 | Admitting: Family Medicine

## 2018-03-15 ENCOUNTER — Ambulatory Visit: Payer: 59 | Admitting: Family Medicine

## 2018-03-15 ENCOUNTER — Encounter: Payer: Self-pay | Admitting: Family Medicine

## 2018-03-15 ENCOUNTER — Other Ambulatory Visit (HOSPITAL_COMMUNITY)
Admission: RE | Admit: 2018-03-15 | Discharge: 2018-03-15 | Disposition: A | Discharge status: Home / Self Care | Payer: 59 | Source: Ambulatory Visit | Attending: Family Medicine | Admitting: Family Medicine

## 2018-03-15 VITALS — BP 120/90 | HR 68 | Temp 97.9°F | Wt 224.6 lb

## 2018-03-15 DIAGNOSIS — K4090 Unilateral inguinal hernia, without obstruction or gangrene, not specified as recurrent: Secondary | ICD-10-CM

## 2018-03-15 DIAGNOSIS — R350 Frequency of micturition: Secondary | ICD-10-CM | POA: Insufficient documentation

## 2018-03-15 LAB — POC URINALSYSI DIPSTICK (AUTOMATED)
Bilirubin, UA: NEGATIVE
Blood, UA: NEGATIVE
Glucose, UA: NEGATIVE
Ketones, UA: NEGATIVE
LEUKOCYTES UA: NEGATIVE
Nitrite, UA: NEGATIVE
PROTEIN UA: NEGATIVE
Spec Grav, UA: 1.025 (ref 1.010–1.025)
UROBILINOGEN UA: 0.2 U/dL
pH, UA: 6 (ref 5.0–8.0)

## 2018-03-15 NOTE — Patient Instructions (Signed)
Hernia A hernia happens when an organ or tissue inside your body pushes out through a weak spot in the belly (abdomen). Follow these instructions at home:  Avoid stretching or overusing (straining) the muscles near the hernia.  Do not lift anything heavier than 10 lb (4.5 kg).  Use the muscles in your leg when you lift something up. Do not use the muscles in your back.  When you cough, try to cough gently.  Eat a diet that has a lot of fiber. Eat lots of fruits and vegetables.  Drink enough fluids to keep your pee (urine) clear or pale yellow. Try to drink 6-8 glasses of water a day.  Take medicines to make your poop soft (stool softeners) as told by your doctor.  Lose weight, if you are overweight.  Do not use any tobacco products, including cigarettes, chewing tobacco, or electronic cigarettes. If you need help quitting, ask your doctor.  Keep all follow-up visits as told by your doctor. This is important. Contact a doctor if:  The skin by the hernia gets puffy (swollen) or red.  The hernia is painful. Get help right away if:  You have a fever.  You have belly pain that is getting worse.  You feel sick to your stomach (nauseous) or you throw up (vomit).  You cannot push the hernia back in place by gently pressing on it while you are lying down.  The hernia: ? Changes in shape or size. ? Is stuck outside your belly. ? Changes color. ? Feels hard or tender. This information is not intended to replace advice given to you by your health care provider. Make sure you discuss any questions you have with your health care provider. Document Released: 03/16/2010 Document Revised: 03/03/2016 Document Reviewed: 08/06/2014 Elsevier Interactive Patient Education  2018 [employer]  

## 2018-03-15 NOTE — Assessment & Plan Note (Signed)
Would like to have repaired Discussed red flags, given handout Referral to surgery.

## 2018-03-15 NOTE — Assessment & Plan Note (Signed)
Likely due to increased fluid intake however will send probe for STD testing given reported unprotected sex.

## 2018-03-15 NOTE — Progress Notes (Signed)
Justin Andrade - 32 y.o. male MRN [id number]  Date of birth: [birth year]  Subjective Chief Complaint  Patient presents with  . Groin Pain    HPI Justin Andrade is a 32 y.o. male here today with complaint of groin pain.  He reports that he first noticed about a month ago with worsening over the past couple weeks.  He thought initially this may be a kidney stone and has increased his fluid intake without much improvement.  He has had some intermittent testicular pain as well and admits to unprotected sex.  He has had urinary frequency but denies penile discharge, dysuria, fever, chills, ulcerations or rash.   ROS: ROS completed and negative except as noted per HPI  Allergies  Allergen Reactions  . Sulfa Antibiotics     Had an allergic reaction as a child. Does not remeber    Past Medical History:  Diagnosis Date  . Myofascial pain     No past surgical history on file.  Social History   Socioeconomic History  . Marital status: Single    Spouse name: Not on file  . Number of children: 0  . Years of education: 54  . Highest education level: Not on file  Occupational History  . 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 Financial resource strain: Not on file  . Food insecurity:    Worry: Not on file    Inability: Not on file  . Transportation needs:    Medical: Not on file    Non-medical: Not on file  Tobacco Use  . Smoking status: Former Smoker    Packs/day: 0.50    Years: 5.00    Pack years: 2.50    Types: Cigarettes    Last attempt to quit: 01/30/2011    Years since quitting: 7.1  . Smokeless tobacco: Never Used  Substance and Sexual Activity  . Alcohol use: Yes    Alcohol/week: 2.4 oz    Types: 4 Cans of beer per week  . Drug use: No  . Sexual activity: Not on file  Lifestyle  . Physical activity:    Days per week: Not on file    Minutes per session: Not on file  . Stress: Not on file  Relationships  . Social connections:    Talks on phone: Not on file    Gets  together: Not on file    Attends religious service: Not on file    Active member of club or organization: Not on file    Attends meetings of clubs or organizations: Not on file    Relationship status: Not on file  Other Topics Concern  . 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 Hypertension Mother   . Depression Father     Health Maintenance  Topic Date Due  . TETANUS/TDAP  12/27/2002  . INFLUENZA VACCINE  05/10/2018  . HIV Screening  Completed    ----------------------------------------------------------------------------------------------------------------------------------------------------------------------------------------------------------------- Physical Exam BP 120/90   Pulse 68   Temp 97.9 F (36.6 C)   Wt 224 lb 9.6 oz (101.9 kg)   BMI 28.84 kg/m   Physical Exam  Constitutional: He is oriented to person, place, and time. He appears well-nourished. No distress.  HENT:  Head: Normocephalic and atraumatic.  Eyes: No scleral icterus.  Neck: Neck supple.  Cardiovascular: Normal rate, regular rhythm and normal heart sounds.  Pulmonary/Chest: Effort normal and breath sounds normal.  Abdominal: A hernia is present. Hernia confirmed positive in the right inguinal area. Hernia confirmed negative in the left inguinal area.  Genitourinary: Testes normal and penis normal. Right testis shows no mass and no tenderness. Left testis shows no mass and no tenderness. Circumcised. No penile tenderness.     Lymphadenopathy: No inguinal adenopathy noted on the right or left side.  Neurological: He is alert and oriented to person, place, and time.     ------------------------------------------------------------------------------------------------------------------------------------------------------------------------------------------------------------------- Assessment and Plan  Urine frequency Likely due to increased fluid intake however will send probe for STD testing given reported unprotected sex.   Right inguinal hernia Would like to have repaired Discussed red flags, given handout Referral to surgery.

## 2018-03-16 LAB — CYTOLOGY, (ORAL, ANAL, URETHRAL) ANCILLARY ONLY
CHLAMYDIA, DNA PROBE: NEGATIVE
Neisseria Gonorrhea: NEGATIVE

## 2018-03-19 ENCOUNTER — Ambulatory Visit: Payer: 59 | Admitting: Family Medicine

## 2018-03-19 NOTE — Progress Notes (Signed)
GC/Chlamydia are negative.

## 2018-03-27 ENCOUNTER — Other Ambulatory Visit: Payer: Self-pay

## 2018-03-27 ENCOUNTER — Ambulatory Visit: Payer: Self-pay | Admitting: Surgery

## 2018-03-27 ENCOUNTER — Encounter (HOSPITAL_COMMUNITY): Payer: Self-pay | Admitting: *Deleted

## 2018-03-27 DIAGNOSIS — K4090 Unilateral inguinal hernia, without obstruction or gangrene, not specified as recurrent: Secondary | ICD-10-CM | POA: Diagnosis not present

## 2018-03-29 ENCOUNTER — Ambulatory Visit (HOSPITAL_COMMUNITY): Payer: 59 | Admitting: Anesthesiology

## 2018-03-29 ENCOUNTER — Encounter (HOSPITAL_COMMUNITY)
Admission: RE | Disposition: A | Discharge status: Home / Self Care | Payer: Self-pay | Source: Ambulatory Visit | Attending: Surgery

## 2018-03-29 ENCOUNTER — Encounter (HOSPITAL_COMMUNITY): Payer: Self-pay | Admitting: Certified Registered Nurse Anesthetist

## 2018-03-29 ENCOUNTER — Ambulatory Visit (HOSPITAL_COMMUNITY)
Admission: RE | Admit: 2018-03-29 | Discharge: 2018-03-29 | Disposition: A | Discharge status: Home / Self Care | Payer: 59 | Source: Ambulatory Visit | Attending: Surgery | Admitting: Surgery

## 2018-03-29 DIAGNOSIS — K4020 Bilateral inguinal hernia, without obstruction or gangrene, not specified as recurrent: Secondary | ICD-10-CM | POA: Insufficient documentation

## 2018-03-29 DIAGNOSIS — F17210 Nicotine dependence, cigarettes, uncomplicated: Secondary | ICD-10-CM | POA: Insufficient documentation

## 2018-03-29 DIAGNOSIS — F419 Anxiety disorder, unspecified: Secondary | ICD-10-CM | POA: Insufficient documentation

## 2018-03-29 DIAGNOSIS — K4120 Bilateral femoral hernia, without obstruction or gangrene, not specified as recurrent: Secondary | ICD-10-CM

## 2018-03-29 DIAGNOSIS — R350 Frequency of micturition: Secondary | ICD-10-CM | POA: Diagnosis not present

## 2018-03-29 DIAGNOSIS — Z8249 Family history of ischemic heart disease and other diseases of the circulatory system: Secondary | ICD-10-CM | POA: Diagnosis not present

## 2018-03-29 DIAGNOSIS — K4090 Unilateral inguinal hernia, without obstruction or gangrene, not specified as recurrent: Secondary | ICD-10-CM

## 2018-03-29 DIAGNOSIS — Z882 Allergy status to sulfonamides status: Secondary | ICD-10-CM | POA: Diagnosis not present

## 2018-03-29 DIAGNOSIS — D176 Benign lipomatous neoplasm of spermatic cord: Secondary | ICD-10-CM | POA: Insufficient documentation

## 2018-03-29 DIAGNOSIS — J029 Acute pharyngitis, unspecified: Secondary | ICD-10-CM | POA: Diagnosis not present

## 2018-03-29 HISTORY — PX: INSERTION OF MESH: SHX5868

## 2018-03-29 HISTORY — PX: INGUINAL HERNIA REPAIR: SHX194

## 2018-03-29 HISTORY — DX: Anxiety disorder, unspecified: F41.9

## 2018-03-29 LAB — CBC
HEMATOCRIT: 45.1 % (ref 39.0–52.0)
Hemoglobin: 15.7 g/dL (ref 13.0–17.0)
MCH: 30.8 pg (ref 26.0–34.0)
MCHC: 34.8 g/dL (ref 30.0–36.0)
MCV: 88.4 fL (ref 78.0–100.0)
Platelets: 221 10*3/uL (ref 150–400)
RBC: 5.1 MIL/uL (ref 4.22–5.81)
RDW: 12.4 % (ref 11.5–15.5)
WBC: 7.2 10*3/uL (ref 4.0–10.5)

## 2018-03-29 SURGERY — REPAIR, HERNIA, INGUINAL, LAPAROSCOPIC
Anesthesia: General | Laterality: Right

## 2018-03-29 MED ORDER — LIDOCAINE 2% (20 MG/ML) 5 ML SYRINGE
INTRAMUSCULAR | Status: AC
  Filled 2018-03-29: qty 5

## 2018-03-29 MED ORDER — HYDROMORPHONE HCL 1 MG/ML IJ SOLN
INTRAMUSCULAR | Status: AC
  Filled 2018-03-29: qty 1

## 2018-03-29 MED ORDER — ONDANSETRON HCL 4 MG PO TABS
4.0000 mg | ORAL_TABLET | Freq: Once | ORAL | Status: DC
  Filled 2018-03-29: qty 1

## 2018-03-29 MED ORDER — ACETAMINOPHEN 160 MG/5ML PO SOLN: 325.0000 mg | ORAL | Status: DC | PRN

## 2018-03-29 MED ORDER — FENTANYL CITRATE (PF) 100 MCG/2ML IJ SOLN
25.0000 ug | INTRAMUSCULAR | Status: DC | PRN
  Administered 2018-03-29 (×3): 50 ug via INTRAVENOUS

## 2018-03-29 MED ORDER — BUPIVACAINE-EPINEPHRINE 0.25% -1:200000 IJ SOLN
INTRAMUSCULAR | Status: DC | PRN
  Administered 2018-03-29: 60 mL

## 2018-03-29 MED ORDER — NAPROXEN 500 MG PO TABS: 500.0000 mg | ORAL_TABLET | Freq: Two times a day (BID) | ORAL | 1 refills | Status: DC

## 2018-03-29 MED ORDER — ACETAMINOPHEN 500 MG PO TABS: 1000.0000 mg | ORAL_TABLET | ORAL | Status: DC

## 2018-03-29 MED ORDER — OXYCODONE HCL 5 MG/5ML PO SOLN
5.0000 mg | Freq: Once | ORAL | Status: DC | PRN
  Filled 2018-03-29: qty 5

## 2018-03-29 MED ORDER — CELECOXIB 200 MG PO CAPS
200.0000 mg | ORAL_CAPSULE | ORAL | Status: AC
  Administered 2018-03-29: 200 mg via ORAL
  Filled 2018-03-29: qty 1

## 2018-03-29 MED ORDER — KETAMINE HCL 10 MG/ML IJ SOLN
INTRAMUSCULAR | Status: DC | PRN
  Administered 2018-03-29 (×2): 20 mg via INTRAVENOUS

## 2018-03-29 MED ORDER — FENTANYL CITRATE (PF) 250 MCG/5ML IJ SOLN
INTRAMUSCULAR | Status: DC | PRN
  Administered 2018-03-29 (×4): 50 ug via INTRAVENOUS

## 2018-03-29 MED ORDER — LIDOCAINE 2% (20 MG/ML) 5 ML SYRINGE
INTRAMUSCULAR | Status: DC | PRN
  Administered 2018-03-29: 1.5 mg/kg/h via INTRAVENOUS

## 2018-03-29 MED ORDER — DEXAMETHASONE SODIUM PHOSPHATE 10 MG/ML IJ SOLN
INTRAMUSCULAR | Status: DC | PRN
  Administered 2018-03-29: 10 mg via INTRAVENOUS

## 2018-03-29 MED ORDER — LIDOCAINE 2% (20 MG/ML) 5 ML SYRINGE
INTRAMUSCULAR | Status: DC | PRN
  Administered 2018-03-29: 100 mg via INTRAVENOUS

## 2018-03-29 MED ORDER — KETAMINE HCL 10 MG/ML IJ SOLN
INTRAMUSCULAR | Status: AC
  Filled 2018-03-29: qty 1

## 2018-03-29 MED ORDER — OXYCODONE HCL 5 MG PO TABS: 5.0000 mg | ORAL_TABLET | Freq: Once | ORAL | Status: DC | PRN

## 2018-03-29 MED ORDER — TRAMADOL HCL 50 MG PO TABS: 50.0000 mg | ORAL_TABLET | Freq: Four times a day (QID) | ORAL | 0 refills | Status: DC | PRN

## 2018-03-29 MED ORDER — DEXAMETHASONE SODIUM PHOSPHATE 10 MG/ML IJ SOLN
INTRAMUSCULAR | Status: AC
  Filled 2018-03-29: qty 1

## 2018-03-29 MED ORDER — FENTANYL CITRATE (PF) 100 MCG/2ML IJ SOLN
INTRAMUSCULAR | Status: AC
  Administered 2018-03-29: 50 ug via INTRAVENOUS
  Filled 2018-03-29: qty 2

## 2018-03-29 MED ORDER — MEPERIDINE HCL 50 MG/ML IJ SOLN: 6.2500 mg | INTRAMUSCULAR | Status: DC | PRN

## 2018-03-29 MED ORDER — MIDAZOLAM HCL 5 MG/5ML IJ SOLN
INTRAMUSCULAR | Status: DC | PRN
  Administered 2018-03-29: 2 mg via INTRAVENOUS

## 2018-03-29 MED ORDER — CHLORHEXIDINE GLUCONATE CLOTH 2 % EX PADS: 6.0000 | MEDICATED_PAD | Freq: Once | CUTANEOUS | Status: DC

## 2018-03-29 MED ORDER — CEFAZOLIN SODIUM-DEXTROSE 2-4 GM/100ML-% IV SOLN
2.0000 g | INTRAVENOUS | Status: AC
  Administered 2018-03-29: 2 g via INTRAVENOUS
  Filled 2018-03-29: qty 100

## 2018-03-29 MED ORDER — ACETAMINOPHEN 500 MG PO TABS
1000.0000 mg | ORAL_TABLET | ORAL | Status: AC
  Administered 2018-03-29: 1000 mg via ORAL
  Filled 2018-03-29: qty 2

## 2018-03-29 MED ORDER — ROCURONIUM BROMIDE 100 MG/10ML IV SOLN
INTRAVENOUS | Status: AC
  Filled 2018-03-29: qty 1

## 2018-03-29 MED ORDER — ONDANSETRON 4 MG PO TBDP
ORAL_TABLET | ORAL | Status: AC
  Administered 2018-03-29: 4 mg
  Filled 2018-03-29: qty 1

## 2018-03-29 MED ORDER — PROPOFOL 10 MG/ML IV BOLUS
INTRAVENOUS | Status: AC
  Filled 2018-03-29: qty 20

## 2018-03-29 MED ORDER — HYDROMORPHONE HCL 1 MG/ML IJ SOLN
0.2500 mg | INTRAMUSCULAR | Status: DC | PRN
  Administered 2018-03-29 (×3): 0.5 mg via INTRAVENOUS

## 2018-03-29 MED ORDER — ONDANSETRON HCL 4 MG/2ML IJ SOLN
INTRAMUSCULAR | Status: AC
  Filled 2018-03-29: qty 2

## 2018-03-29 MED ORDER — HYDROMORPHONE HCL 1 MG/ML IJ SOLN
INTRAMUSCULAR | Status: AC
  Administered 2018-03-29: 0.5 mg via INTRAVENOUS
  Filled 2018-03-29: qty 1

## 2018-03-29 MED ORDER — SUGAMMADEX SODIUM 200 MG/2ML IV SOLN
INTRAVENOUS | Status: DC | PRN
  Administered 2018-03-29: 200 mg via INTRAVENOUS

## 2018-03-29 MED ORDER — MIDAZOLAM HCL 2 MG/2ML IJ SOLN
INTRAMUSCULAR | Status: AC
  Filled 2018-03-29: qty 2

## 2018-03-29 MED ORDER — ONDANSETRON HCL 4 MG/2ML IJ SOLN
INTRAMUSCULAR | Status: DC | PRN
  Administered 2018-03-29: 4 mg via INTRAVENOUS

## 2018-03-29 MED ORDER — BUPIVACAINE-EPINEPHRINE (PF) 0.25% -1:200000 IJ SOLN
INTRAMUSCULAR | Status: AC
  Filled 2018-03-29: qty 60

## 2018-03-29 MED ORDER — LACTATED RINGERS IV SOLN
INTRAVENOUS | Status: DC
  Administered 2018-03-29: 13:00:00 via INTRAVENOUS

## 2018-03-29 MED ORDER — FENTANYL CITRATE (PF) 250 MCG/5ML IJ SOLN
INTRAMUSCULAR | Status: AC
  Filled 2018-03-29: qty 5

## 2018-03-29 MED ORDER — CHLORHEXIDINE GLUCONATE CLOTH 2 % EX PADS
6.0000 | MEDICATED_PAD | Freq: Once | CUTANEOUS | Status: AC
  Administered 2018-03-29: 6 via TOPICAL

## 2018-03-29 MED ORDER — ACETAMINOPHEN 325 MG PO TABS: 325.0000 mg | ORAL_TABLET | ORAL | Status: DC | PRN

## 2018-03-29 MED ORDER — ONDANSETRON HCL 4 MG/2ML IJ SOLN: 4.0000 mg | Freq: Once | INTRAMUSCULAR | Status: DC | PRN

## 2018-03-29 MED ORDER — SUGAMMADEX SODIUM 200 MG/2ML IV SOLN
INTRAVENOUS | Status: AC
  Filled 2018-03-29: qty 2

## 2018-03-29 MED ORDER — MEPERIDINE HCL 50 MG/ML IJ SOLN
INTRAMUSCULAR | Status: AC
  Filled 2018-03-29: qty 1

## 2018-03-29 MED ORDER — PROPOFOL 10 MG/ML IV BOLUS
INTRAVENOUS | Status: DC | PRN
  Administered 2018-03-29: 200 mg via INTRAVENOUS

## 2018-03-29 MED ORDER — FENTANYL CITRATE (PF) 100 MCG/2ML IJ SOLN
INTRAMUSCULAR | Status: AC
  Filled 2018-03-29: qty 2

## 2018-03-29 MED ORDER — ROCURONIUM BROMIDE 10 MG/ML (PF) SYRINGE
PREFILLED_SYRINGE | INTRAVENOUS | Status: DC | PRN
  Administered 2018-03-29: 10 mg via INTRAVENOUS
  Administered 2018-03-29: 50 mg via INTRAVENOUS
  Administered 2018-03-29: 20 mg via INTRAVENOUS
  Administered 2018-03-29: 10 mg via INTRAVENOUS

## 2018-03-29 MED ORDER — GABAPENTIN 300 MG PO CAPS
300.0000 mg | ORAL_CAPSULE | ORAL | Status: AC
  Administered 2018-03-29: 300 mg via ORAL
  Filled 2018-03-29: qty 1

## 2018-03-29 SURGICAL SUPPLY — 34 items
CABLE HIGH FREQUENCY MONO STRZ (ELECTRODE) ×3 IMPLANT
CHLORAPREP W/TINT 26ML (MISCELLANEOUS) ×3 IMPLANT
COVER SURGICAL LIGHT HANDLE (MISCELLANEOUS) ×3 IMPLANT
DECANTER SPIKE VIAL GLASS SM (MISCELLANEOUS) ×3 IMPLANT
DEVICE SECURE STRAP 25 ABSORB (INSTRUMENTS) IMPLANT
DRAPE WARM FLUID 44X44 (DRAPE) ×3 IMPLANT
DRSG TEGADERM 2-3/8X2-3/4 SM (GAUZE/BANDAGES/DRESSINGS) ×6 IMPLANT
DRSG TEGADERM 4X4.75 (GAUZE/BANDAGES/DRESSINGS) ×3 IMPLANT
ELECT REM PT RETURN 15FT ADLT (MISCELLANEOUS) ×3 IMPLANT
GAUZE SPONGE 2X2 8PLY STRL LF (GAUZE/BANDAGES/DRESSINGS) ×2 IMPLANT
GLOVE ECLIPSE 8.0 STRL XLNG CF (GLOVE) ×24 IMPLANT
GLOVE INDICATOR 8.0 STRL GRN (GLOVE) ×3 IMPLANT
GOWN STRL REUS W/TWL XL LVL3 (GOWN DISPOSABLE) ×12 IMPLANT
IRRIG SUCT STRYKERFLOW 2 WTIP (MISCELLANEOUS) ×3
IRRIGATION SUCT STRKRFLW 2 WTP (MISCELLANEOUS) ×2 IMPLANT
KIT BASIN OR (CUSTOM PROCEDURE TRAY) ×3 IMPLANT
MESH ULTRAPRO 6X6 15CM15CM (Mesh General) ×9 IMPLANT
NEEDLE INSUFFLATION 14GA 120MM (NEEDLE) IMPLANT
PAD POSITIONING PINK XL (MISCELLANEOUS) ×3 IMPLANT
SCISSORS LAP 5X35 DISP (ENDOMECHANICALS) ×6 IMPLANT
SLEEVE ADV FIXATION 5X100MM (TROCAR) ×3 IMPLANT
SPONGE GAUZE 2X2 STER 10/PKG (GAUZE/BANDAGES/DRESSINGS) ×1
SUT MNCRL AB 4-0 PS2 18 (SUTURE) ×3 IMPLANT
SUT PDS AB 1 CT1 27 (SUTURE) ×3 IMPLANT
SUT VIC AB 2-0 SH 27 (SUTURE) ×1
SUT VIC AB 2-0 SH 27X BRD (SUTURE) ×2 IMPLANT
SUT VICRYL 0 UR6 27IN ABS (SUTURE) ×3 IMPLANT
TACKER 5MM HERNIA 3.5CML NAB (ENDOMECHANICALS) IMPLANT
TOWEL OR 17X26 10 PK STRL BLUE (TOWEL DISPOSABLE) ×3 IMPLANT
TOWEL OR NON WOVEN STRL DISP B (DISPOSABLE) ×3 IMPLANT
TRAY LAPAROSCOPIC (CUSTOM PROCEDURE TRAY) ×3 IMPLANT
TROCAR ADV FIXATION 5X100MM (TROCAR) ×3 IMPLANT
TROCAR XCEL BLUNT TIP 100MML (ENDOMECHANICALS) ×3 IMPLANT
TUBING INSUF HEATED (TUBING) ×3 IMPLANT

## 2018-03-29 NOTE — Anesthesia Preprocedure Evaluation (Addendum)
Anesthesia Evaluation  Patient identified by MRN, date of birth, ID band Patient awake    Reviewed: Allergy & Precautions, H&P , NPO status , Patient's Chart, lab work & pertinent test results, reviewed documented beta blocker date and time   Airway Mallampati: I  TM Distance: >3 FB Neck ROM: full    Dental no notable dental hx. (+) Teeth Intact   Pulmonary former smoker,    Pulmonary exam normal breath sounds clear to auscultation       Cardiovascular Exercise Tolerance: Good Normal cardiovascular exam Rhythm:regular Rate:Normal     Neuro/Psych Anxiety    GI/Hepatic   Endo/Other    Renal/GU      Musculoskeletal   Abdominal   Peds  Hematology   Anesthesia Other Findings   Reproductive/Obstetrics                           Anesthesia Physical Anesthesia Plan  ASA: II  Anesthesia Plan: General   Post-op Pain Management:    Induction: Intravenous  PONV Risk Score and Plan: 2 and Ondansetron, Dexamethasone and Treatment may vary due to age or medical condition  Airway Management Planned: Oral ETT  Additional Equipment:   Intra-op Plan:   Post-operative Plan: Extubation in OR  Informed Consent: I have reviewed the patients History and Physical, chart, labs and discussed the procedure including the risks, benefits and alternatives for the proposed anesthesia with the patient or authorized representative who has indicated his/her understanding and acceptance.   Dental Advisory Given  Plan Discussed with: CRNA, Anesthesiologist and Surgeon  Anesthesia Plan Comments: (  )        Anesthesia Quick Evaluation

## 2018-03-29 NOTE — Anesthesia Procedure Notes (Signed)
Procedure Name: Intubation Date/Time: 03/29/2018 3:14 PM Performed by: Dione Booze, CRNA Pre-anesthesia Checklist: Suction available, Patient being monitored, Emergency Drugs available and Patient identified Patient Re-evaluated:Patient Re-evaluated prior to induction Oxygen Delivery Method: Circle system utilized Preoxygenation: Pre-oxygenation with 100% oxygen Induction Type: IV induction Ventilation: Mask ventilation without difficulty and Oral airway inserted - appropriate to patient size Laryngoscope Size: Mac and 4 Grade View: Grade I Tube type: Oral Tube size: 7.5 mm Number of attempts: 1 Airway Equipment and Method: Stylet Placement Confirmation: ETT inserted through vocal cords under direct vision,  positive ETCO2 and breath sounds checked- equal and bilateral Secured at: 22 cm Tube secured with: Tape Dental Injury: Teeth and Oropharynx as per pre-operative assessment

## 2018-03-29 NOTE — H&P (Signed)
[name] Documented: 03/27/2018 12:09 PM Location: [facility] Patient #: [id number] DOB: 07/07/1986 Single / Language: [name] / Race: White Male   History of Present Illness Adin Hector MD; 03/27/2018 12:10 PM) The patient is a 32 year old male who presents with an inguinal hernia. Note for "Inguinal hernia": ` ` ` Patient sent for surgical consultation at the request of Dr. Dema Severin  Chief Complaint: Right groin pain and hernia.  The patient is a pleasant active male who's had sharp right groin pain that worsened the past few weeks. Obvious lump. Very sensitive. Taking ibuprofen 800 mg several times a day as well as ice but still sore. Wished to be seen. Thurmond Butts will hernia diagnosed. Surgery offered. Referral made since I have an opening sooner to consider surgery sooner. She has no problems urination or defecation. This his bowels every day. He does occasionally smoke tobacco cigarettes but not more than half pack at a time. No prior abdominal surgery.  (Review of systems as stated in this history (HPI) or in the review of systems. Otherwise all other 12 point ROS are negative) ` ` `    Physical Exam Adin Hector MD; 03/27/2018 12:11 PM) General Mental Status-Alert. General Appearance-Not in acute distress. Voice-Normal.  Integumentary Global Assessment Upon inspection and palpation of skin surfaces of the - Distribution of scalp and body hair is normal. General Characteristics Overall examination of the patient's skin reveals - no rashes and no suspicious lesions.  Head and Neck Head-normocephalic, atraumatic with no lesions or palpable masses. Face Global Assessment - atraumatic, no absence of expression. Neck Global Assessment - no abnormal movements, no decreased range of motion. Trachea-midline. Thyroid Gland Characteristics - non-tender.  Eye Eyeball - Left-Extraocular movements intact, No Nystagmus. Eyeball -  Right-Extraocular movements intact, No Nystagmus. Upper Eyelid - Left-No Cyanotic. Upper Eyelid - Right-No Cyanotic.  Chest and Lung Exam Inspection Accessory muscles - No use of accessory muscles in breathing.  Abdomen Note: Abdomen soft. Nontender, nondistended. No guarding. No diastasis. No umbilical nor other hernias   Male Genitourinary Note: Obvious right groin bulge with reducible inguinal hernia. Sensitive. No hernia on the left side. Right testicular sensitivity but no masses. Epididymides and cords normal.   Peripheral Vascular Upper Extremity Inspection - Left - Not Gangrenous, No Petechiae. Right - Not Gangrenous, No Petechiae.  Neurologic Neurologic evaluation reveals -normal attention span and ability to concentrate, able to name objects and repeat phrases. Appropriate fund of knowledge and normal coordination.  Neuropsychiatric Mental status exam performed with findings of-able to articulate well with normal speech/language, rate, volume and coherence and no evidence of hallucinations, delusions, obsessions or homicidal/suicidal ideation. Orientation-oriented X3.  Musculoskeletal Global Assessment Gait and Station - normal gait and station.  Lymphatic General Lymphatics Description - No Generalized lymphadenopathy.    Assessment & Plan Adin Hector MD; 03/27/2018 12:12 PM) RIGHT INGUINAL HERNIA (K40.90) Impression: Small but very sensitive inguinal hernia.  He would benefit from hernia repair. Reasonable for laparoscopic approach. Diagnostic laparoscopy make sure no contralateral left inguinal hernia. Does not seem to likely at this time. I have an opening potentially do it in 2 days, so we will see who can do it first. He's comortable with either surgeon performing, depending on availability. I agree with adding gabapentin. Continue ice. Consider switching to naproxen 500 mg by mouth twice a day instead of ibuprofen for better pain  control. Current Plans Started Naproxen 500 MG Oral Tablet, 1 (one) Tablet two times  daily, #30, 03/27/2018, Ref. x1. PREOP - ING HERNIA - ENCOUNTER FOR PREOPERATIVE EXAMINATION FOR GENERAL SURGICAL PROCEDURE (Z01.818) Current Plans You are being scheduled for surgery- Our schedulers will call you.  You should hear from our office's scheduling department within 5 working days about the location, date, and time of surgery. We try to make accommodations for patient's preferences in scheduling surgery, but sometimes the OR schedule or the surgeon's schedule prevents [place] from making those accommodations.  If you have not heard from our office [phone]) in 5 working days, call the office and ask for your surgeon's nurse.  If you have other questions about your diagnosis, plan, or surgery, call the office and ask for your surgeon's nurse.  Written instructions provided The anatomy & physiology of the abdominal wall and pelvic floor was discussed. The pathophysiology of hernias in the inguinal and pelvic region was discussed. Natural history risks such as progressive enlargement, pain, incarceration, and strangulation was discussed. Contributors to complications such as smoking, obesity, diabetes, prior surgery, etc were discussed.  I feel the risks of no intervention will lead to serious problems that outweigh the operative risks; therefore, I recommended surgery to reduce and repair the hernia. I explained laparoscopic techniques with possible need for an open approach. I noted usual use of mesh to patch and/or buttress hernia repair  Risks such as bleeding, infection, abscess, need for further treatment, heart attack, death, and other risks were discussed. I noted a good likelihood this will help address the problem. Goals of post-operative recovery were discussed as well. Possibility that this will not correct all symptoms was explained. I stressed the importance of low-impact  activity, aggressive pain control, avoiding constipation, & not pushing through pain to minimize risk of post-operative chronic pain or injury. Possibility of reherniation was discussed. We will work to minimize complications.  An educational handout further explaining the pathology & treatment options was given as well. Questions were answered. The patient expresses understanding & wishes to proceed with surgery.  Pt Education - Pamphlet Given - Laparoscopic Hernia Repair: discussed with patient and provided information. Pt Education - CCS Pain Control (Jenne Sellinger) Pt Education - [facility] Hernia Post-Op HCI (Mikalyn Hermida): discussed with patient and provided information. Pt Education - [facility] Mesh education: discussed with patient and provided information.  Adin Hector, MD, FACS, MASCRS Gastrointestinal and Minimally Invasive Surgery    [address] [place], [place] [address] [phone] Main / Paging [phone] Fax

## 2018-03-29 NOTE — Op Note (Signed)
03/29/2018  5:06 PM  PATIENT:  Justin Andrade  32 y.o. male  Patient Care Team: Luetta Nutting, DO as PCP - General (Family Medicine) Michael Boston, MD as Consulting Physician (General Surgery)  PRE-OPERATIVE DIAGNOSIS:  Right inguinal hernia, possible left inguinal hernia  POST-OPERATIVE DIAGNOSIS:   Bilateral inguinal hernias Bilateral femoral hernias  PROCEDURE:    LAPAROSCOPIC BILATERAL INGUINAL HERNIA REPAIRS Laparoscopic bilateral femoral hernia repairs INSERTION OF MESH  SURGEON:  Adin Hector, MD  ASSISTANT: None  ANESTHESIA:     Regional ilioinguinal and genitofemoral and spermatic cord nerve blocks  General  EBL:  Total I/O In: 500 [I.V.:500] Out: 10 [Blood:10].  See anesthesia record  Delay start of Pharmacological VTE agent (>24hrs) due to surgical blood loss or risk of bleeding:  no  DRAINS: NONE  SPECIMEN:  NONE  DISPOSITION OF SPECIMEN:  N/A  COUNTS:  YES  PLAN OF CARE: Discharge to home after PACU  PATIENT DISPOSITION:  PACU - hemodynamically stable.  INDICATION: Pleasant male with worsening right groin pain and swelling concerning for symptomatic right internal hernia.  Mild impulse and sensitivity at left side suspicious for possible left inguinal hernia as well.  I recommended diagnostic laparoscopy with repair of hernias found.  The anatomy & physiology of the abdominal wall and pelvic floor was discussed.  The pathophysiology of hernias in the inguinal and pelvic region was discussed.  Natural history risks such as progressive enlargement, pain, incarceration & strangulation was discussed.   Contributors to complications such as smoking, obesity, diabetes, prior surgery, etc were discussed.    I feel the risks of no intervention will lead to serious problems that outweigh the operative risks; therefore, I recommended surgery to reduce and repair the hernia.  I explained laparoscopic techniques with possible need for an open approach.  I noted  usual use of mesh to patch and/or buttress hernia repair  Risks such as bleeding, infection, abscess, need for further treatment, heart attack, death, and other risks were discussed.  I noted a good likelihood this will help address the problem.   Goals of post-operative recovery were discussed as well.  Possibility that this will not correct all symptoms was explained.  I stressed the importance of low-impact activity, aggressive pain control, avoiding constipation, & not pushing through pain to minimize risk of post-operative chronic pain or injury. Possibility of reherniation was discussed.  We will work to minimize complications.     An educational handout further explaining the pathology & treatment options was given as well.  Questions were answered.  The patient expresses understanding & wishes to proceed with surgery.  OR FINDINGS: Obvious indirect right inguinal hernia.  Small femoral hernia as well.  No direct space or obturator hernias.  On the left side he had subtle direct space and femoral hernias.  Not much of an indirect inguinal hernia.  No obturator hernia.  DESCRIPTION:  The patient was identified & brought into the operating room. The patient was positioned supine with arms tucked. SCDs were active during the entire case. The patient underwent general anesthesia without any difficulty.  The abdomen was prepped and draped in a sterile fashion. The patient's bladder was emptied.  A Surgical Timeout confirmed our plan.  I made a transverse incision through the inferior umbilical fold.  I made a small transverse nick through the anterior rectus fascia contralateral to the inguinal hernia side and placed a 0-vicryl stitch through the fascia.  I placed a Hasson trocar into the preperitoneal plane.  Entry was clean.  We induced carbon dioxide insufflation. Camera inspection revealed no injury.  I used a 38mm angled scope to bluntly free the peritoneum off the infraumbilical anterior  abdominal wall.  I created enough of a preperitoneal pocket to place 86mm ports into the right & left mid-abdomen into this preperitoneal cavity.  I focused attention on the RIGHT pelvis since that was the dominant hernia side.   I used blunt & focused sharp dissection to free the peritoneum off the flank and down to the pubic rim.  I freed the anteriolateral bladder wall off the anteriolateral pelvic wall, sparing midline attachments.   I located a swath of peritoneum going into a hernia fascial defect at the  internal ring consistent with  an indirect inguinal hernia..  I gradually freed the peritoneal hernia sac off safely and reduced it into the preperitoneal space.  I freed the peritoneum off the  spermatic vessels & vas deferens.  I freed peritoneum off the retroperitoneum along the psoas muscle.  Obvious right femoral hernia noted as well.  Small.  Spermatic cord lipoma was dissected away & removed.  I checked & assured hemostasis.     I turned attention on the opposite  LEFT pelvis.  I did dissection in a similar, mirror-image fashion. The patient had a direct space inguinal hernia.  Smaller but definite femoral hernia as well.Marland Kitchen    Spermatic cord lipoma was dissected away & removed.    I checked & assured hemostasis.  As anticipated, surgical dissection was challenged by dense adhesions resulting in the need for careful repair of the resulting left paramedian peritoneal defect. Repair was done with 2-0 vicryl minimally invasive intracorporeal suturing using absorbable suture   I chose 15x15 cm sheets of ultra-lightweight polypropylene mesh (Ultrapro), one for each side.  I cut a single sigmoid-shaped slit ~6cm from a corner of each mesh.  I placed the meshes into the preperitoneal space & laid them as overlapping diamonds such that at the inferior points, a 6x6 cm corner flap rested in the true anterolateral pelvis, covering the obturator & femoral foramina.   I allowed the bladder to return to the  pubis, this helping tuck the corners of the mesh in the anteriolateral pelvis.  The medial corners overlapped each other across midline cephalad to the pubic rim.   Given the numerous hernias of moderate size, I placed a third 15x15cm mesh in the center as a vertical diamond.  The lateral wings of the mesh overlap across the direct spaces and internal rings where the dominant hernias were.  This provided good coverage and reinforcement of the hernia repairs.  Because of the central mesh placement with good overlap, I did not place any tacks.   I held the hernia sacs cephalad & evacuated carbon dioxide.  I closed the fascia with absorbable suture.  I closed the skin using 4-0 monocryl stitch.  Sterile dressings were applied.   The patient was extubated & arrived in the PACU in stable condition..  I had discussed postoperative care with the patient in the holding area.  Instructions are written in the chart.  I discussed operative findings, updated the patient's status, discussed probable steps to recovery, and gave postoperative recommendations to the patient's family.  Recommendations were made.  Questions were answered.  He expressed understanding & appreciation.   Adin Hector, M.D., F.A.C.S. Gastrointestinal and Minimally Invasive Surgery [facility] [address] [place], [place] [address] [phone] Main /  Paging  03/29/2018 5:06 PM

## 2018-03-29 NOTE — Discharge Instructions (Signed)
HERNIA REPAIR: POST OP INSTRUCTIONS ° °###################################################################### ° °EAT °Gradually transition to a high fiber diet with a fiber supplement over the next few weeks after discharge.  Start with a pureed / full liquid diet (see below) ° °WALK °Walk an hour a day.  Control your pain to do that.   ° °CONTROL PAIN °Control pain so that you can walk, sleep, tolerate sneezing/coughing, and go up/down stairs. ° °HAVE A BOWEL MOVEMENT DAILY °Keep your bowels regular to avoid problems.  OK to try a laxative to override constipation.  OK to use an antidairrheal to slow down diarrhea.  Call if not better after 2 tries ° °CALL IF YOU HAVE PROBLEMS/CONCERNS °Call if you are still struggling despite following these instructions. °Call if you have concerns not answered by these instructions ° °###################################################################### ° ° ° °1. DIET: Follow a light bland diet the first 24 hours after arrival home, such as soup, liquids, crackers, etc.  Be sure to include lots of fluids daily.  Advance to a low fat / high fiber diet over the next few days after surgery.  Avoid fast food or heavy meals the first week as your are more likely to get nauseated.   ° °2. Take your usually prescribed home medications unless otherwise directed. ° °3. PAIN CONTROL: °a. Pain is best controlled by a usual combination of three different methods TOGETHER: °i. Ice/Heat °ii. Over the counter pain medication °iii. Prescription pain medication °b. Most patients will experience some swelling and bruising around the hernia(s) such as the bellybutton, groins, or old incisions.  Ice packs or heating pads (30-60 minutes up to 6 times a day) will help. Use ice for the first few days to help decrease swelling and bruising, then switch to heat to help relax tight/sore spots and speed recovery.  Some people prefer to use ice alone, heat alone, alternating between ice & heat.  Experiment  to what works for you.  Swelling and bruising can take several weeks to resolve.   °c. It is helpful to take an over-the-counter pain medication regularly for the first few weeks.  Choose one of the following that works best for you: °i. Naproxen (Aleve, etc)  Two 220mg tabs twice a day °ii. Ibuprofen (Advil, etc) Three 200mg tabs four times a day (every meal & bedtime) °iii. Acetaminophen (Tylenol, etc) 325-650mg four times a day (every meal & bedtime) °d. A  prescription for pain medication should be given to you upon discharge.  Take your pain medication as prescribed.  °i. If you are having problems/concerns with the prescription medicine (does not control pain, nausea, vomiting, rash, itching, etc), please call us [phone] to see if we need to switch you to a different pain medicine that will work better for you and/or control your side effect better. °ii. If you need a refill on your pain medication, please contact your pharmacy.  They will contact our office to request authorization. Prescriptions will not be filled after 5 pm or on week-ends. ° °4. Avoid getting constipated.  Between the surgery and the pain medications, it is common to experience some constipation.  Increasing fluid intake and taking a fiber supplement (such as Metamucil, Citrucel, FiberCon, MiraLax, etc) 1-2 times a day regularly will usually help prevent this problem from occurring.  A mild laxative (prune juice, Milk of Magnesia, MiraLax, etc) should be taken according to package directions if there are no bowel movements after 48 hours.   ° °5. Wash / shower every   day.  You may shower over the dressings as they are waterproof.    6. Remove your waterproof bandages 5 days after surgery.  You may leave the incisions open to air.  You may replace a dressing/Band-Aid to cover an incision for comfort if you wish.  Continue to shower over incision(s) after the dressing is off.  7. ACTIVITIES as tolerated:   a. You may resume  regular (light) daily activities beginning the next day--such as daily self-care, walking, climbing stairs--gradually increasing activities as tolerated.  If you can walk 30 minutes without difficulty, it is safe to try more intense activity such as jogging, treadmill, bicycling, low-impact aerobics, swimming, etc. b. Save the most intensive and strenuous activity for last such as sit-ups, heavy lifting, contact sports, etc  Refrain from any heavy lifting or straining until you are off narcotics for pain control.   c. DO NOT PUSH THROUGH PAIN.  Let pain be your guide: If it hurts to do something, don't do it.  Pain is your body warning you to avoid that activity for another week until the pain goes down. d. You may drive when you are no longer taking prescription pain medication, you can comfortably wear a seatbelt, and you can safely maneuver your car and apply brakes. e. Dennis Bast may have sexual intercourse when it is comfortable.   8. FOLLOW UP in our office a. Please call [facility] at [phone] to set up an appointment to see your surgeon in the office for a follow-up appointment approximately 2-3 weeks after your surgery. b. Make sure that you call for this appointment the day you arrive home to insure a convenient appointment time.  9.  If you have disability of FMLA / Family leave forms, please bring the forms to the office for processing.  (do not give to your surgeon).  WHEN TO CALL us [phone]: 1. Poor pain control 2. Reactions / problems with new medications (rash/itching, nausea, etc)  3. Fever over 101.5 F (38.5 C) 4. Inability to urinate 5. Nausea and/or vomiting 6. Worsening swelling or bruising 7. Continued bleeding from incision. 8. Increased pain, redness, or drainage from the incision   The clinic staff is available to answer your questions during regular business hours (8:30am-5pm).  Please dont hesitate to call and ask to speak to one of our nurses for clinical concerns.    If you have a medical emergency, go to the nearest emergency room or call 911.  A surgeon from [facility] is always on call at the hospitals in [facility], [place], [place], [place], [place]  [address] ?  [address] [address], [place],    [address] MAIN: [phone] ? TOLL FREE: [phone] ? FAX: [phone] [email or web address]

## 2018-03-29 NOTE — Interval H&P Note (Signed)
History and Physical Interval Note:  03/29/2018 1:59 PM  Justin Andrade  has presented today for surgery, with the diagnosis of right inguinal hernia  The various methods of treatment have been discussed with the patient and family. After consideration of risks, benefits and other options for treatment, the patient has consented to  Procedure(s): LAPAROSCOPIC RIGHT INGUINAL HERNIA POSSIBLE LEFT (Right) INSERTION OF MESH (Right) as a surgical intervention .  The patient's history has been reviewed, patient examined, no change in status, stable for surgery.  I have reviewed the patient's chart and labs.  Questions were answered to the patient's satisfaction.    I have re-reviewed the the patient's records, history, medications, and allergies.  I have re-examined the patient.  I again discussed intraoperative plans and goals of post-operative recovery.  The patient agrees to proceed.  Justin Andrade  1986-04-25 [id number]  Patient Care Team: Luetta Nutting, DO as PCP - General (Family Medicine)  Patient Active Problem List   Diagnosis Date Noted  . Right inguinal hernia 03/15/2018  . Urine frequency 03/15/2018  . Encounter for well adult exam without abnormal findings 01/29/2018  . Myofascial pain 01/19/2018  . Sore throat 06/23/2017  . Acute shoulder bursitis, right 05/02/2017  . History of closed shoulder dislocation 05/02/2017  . Right shoulder pain 04/06/2017  . Acute pharyngitis 03/03/2017  . Concern about STD in male without diagnosis 12/06/2016  . Skin tag 12/06/2016    Past Medical History:  Diagnosis Date  . Anxiety   . Myofascial pain     Past Surgical History:  Procedure Laterality Date  . NO PAST SURGERIES      Social History   Socioeconomic History  . Marital status: Single    Spouse name: Not on file  . Number of children: 0  . Years of education: 52  . Highest education level: Not on file  Occupational History  . 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  Financial resource strain: Not on file  . Food insecurity:    Worry: Not on file    Inability: Not on file  . Transportation needs:    Medical: Not on file    Non-medical: Not on file  Tobacco Use  . Smoking status: Former Smoker    Packs/day: 0.50    Years: 5.00    Pack years: 2.50    Types: Cigarettes    Last attempt to quit: 01/30/2011    Years since quitting: 7.1  . Smokeless tobacco: Never Used  . Tobacco comment: states he smoked  today 03/29/18  Substance and Sexual Activity  . Alcohol use: Yes    Alcohol/week: 2.4 oz    Types: 4 Cans of beer per week    Comment: 2 beersper week   . Drug use: No  . Sexual activity: Not on file  Lifestyle  . Physical activity:    Days per week: Not on file    Minutes per session: Not on file  . Stress: Not on file  Relationships  . Social connections:    Talks on phone: Not on file    Gets together: Not on file    Attends religious service: Not on file    Active member of club or organization: Not on file    Attends meetings of clubs or organizations: Not on file    Relationship status: Not on file  . Intimate partner violence:    Fear of current or ex partner: Not on file    Emotionally abused:  Not on file    Physically abused: Not on file    Forced sexual activity: Not on file  Other Topics Concern  . 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 Hypertension Mother   . Depression Father     Medications Prior to Admission  Medication Sig Dispense Refill Last Dose  . Ascorbic Acid (VITAMIN C PO) Take 1 tablet by mouth daily.   03/27/2018  . ferrous sulfate 325 (65 FE) MG EC tablet Take 325 mg by mouth daily.   03/27/2018  . ibuprofen (ADVIL,MOTRIN) 200 MG tablet Take 800 mg by mouth daily as needed for moderate pain.   03/28/2018 at 1200  . Multiple Vitamin (MULTIVITAMIN) tablet Take 1 tablet by mouth daily.    03/27/2018  . Omega-3 Fatty Acids (FISH OIL) 1000 MG CAPS Take 1,000 mg by mouth daily.   03/27/2018  . Vitamin D, Ergocalciferol, (DRISDOL) 50000 units CAPS capsule Take 1 capsule (50,000 Units total) by mouth every 7 (seven) days. (Patient not taking: Reported on 03/27/2018) 12 capsule 0 03/27/2018 at Unknown time    Current Facility-Administered Medications  Medication Dose Route Frequency Provider Last Rate Last Dose  . ceFAZolin (ANCEF) IVPB 2g/100 mL premix  2 g Intravenous On Call to OR Michael Boston, MD      . Chlorhexidine Gluconate Cloth 2 % PADS 6 each  6 each Topical Once Michael Boston, MD      . lactated ringers infusion   Intravenous Continuous Janeece Riggers, MD 75 mL/hr at 03/29/18 1305       Allergies  Allergen Reactions  . Sulfa Antibiotics     Had an allergic reaction as a child. Does not remeber    BP 133/86   Pulse 70   Temp 98 F (36.7 C) (Oral)   Resp 18   Ht 6\' 2"  (1.88 m)   Wt 103.4 kg (228 lb)   SpO2 96%   BMI 29.27 kg/m   Labs: Results for orders placed or performed during the hospital encounter of 03/29/18 (from the past 48 hour(s))  CBC     Status: None   Collection Time: 03/29/18 12:55 PM  Result Value Ref Range   WBC 7.2 4.0 - 10.5 K/uL   RBC 5.10 4.22 - 5.81 MIL/uL   Hemoglobin 15.7 13.0 - 17.0 g/dL   HCT 45.1 39.0 - 52.0 %   MCV 88.4 78.0 - 100.0 fL   MCH 30.8 26.0 - 34.0 pg   MCHC 34.8 30.0 - 36.0 g/dL   RDW 12.4 11.5 - 15.5 %   Platelets 221 150 - 400 K/uL    Comment: Performed at [facility], [place] [address], [place], [place] [address]    Imaging / Studies: No results found.   Adin Hector, M.D., F.A.C.S. Gastrointestinal and Minimally Invasive Surgery [facility] [address] [place],  [address] [phone] Main / Paging  03/29/2018 1:59 PM     Adin Hector

## 2018-03-29 NOTE — Transfer of Care (Signed)
Immediate Anesthesia Transfer of Care Note  Patient: Justin Andrade  Procedure(s) Performed: LAPAROSCOPIC RIGHT AND LEFT INGUINAL HERNIA (Bilateral ) INSERTION OF MESH (Right )  Patient Location: PACU  Anesthesia Type:General  Level of Consciousness: awake, alert , oriented and patient cooperative  Airway & Oxygen Therapy: Patient Spontanous Breathing and Patient connected to face mask oxygen  Post-op Assessment: Report given to RN, Post -op Vital signs reviewed and stable and Patient moving all extremities  Post vital signs: Reviewed and stable  Last Vitals:  Vitals Value Taken Time  BP 151/99 03/29/2018  5:17 PM  Temp    Pulse 87 03/29/2018  5:18 PM  Resp 22 03/29/2018  5:18 PM  SpO2 99 % 03/29/2018  5:18 PM  Vitals shown include unvalidated device data.  Last Pain:  Vitals:   03/29/18 1300  TempSrc:   PainSc: 2       Patients Stated Pain Goal: 3 (10/62/69 4854)  Complications: No apparent anesthesia complications

## 2018-03-30 ENCOUNTER — Encounter (HOSPITAL_COMMUNITY): Payer: Self-pay | Admitting: Surgery

## 2018-04-09 NOTE — Anesthesia Postprocedure Evaluation (Signed)
Anesthesia Post Note  Patient: Justin Andrade  Procedure(s) Performed: LAPAROSCOPIC RIGHT AND LEFT INGUINAL HERNIA (Bilateral ) INSERTION OF MESH (Right )     Patient location during evaluation: PACU Anesthesia Type: General Level of consciousness: awake and alert Pain management: pain level controlled Vital Signs Assessment: post-procedure vital signs reviewed and stable Respiratory status: spontaneous breathing, nonlabored ventilation, respiratory function stable and patient connected to nasal cannula oxygen Cardiovascular status: blood pressure returned to baseline and stable Postop Assessment: no apparent nausea or vomiting Anesthetic complications: no    Last Vitals:  Vitals:   03/29/18 1826 03/29/18 1900  BP: (!) 155/88 135/85  Pulse: 86 84  Resp: 16 12  Temp:  36.5 C  SpO2: 94% 97%    Last Pain:  Vitals:   03/30/18 1212  TempSrc:   PainSc: 1    Pain Goal: Patients Stated Pain Goal: 3 (03/29/18 1300)               Jaleiah Asay S

## 2019-05-21 ENCOUNTER — Telehealth: Payer: Self-pay

## 2019-05-21 NOTE — Telephone Encounter (Signed)
Questions for Screening COVID-19  Symptom onset: n/a  Travel or Contacts: no  During this illness, did/does the patient experience any of the following symptoms? Fever >100.31F []   Yes [x]   No []   Unknown Subjective fever (felt feverish) []   Yes [x]   No []   Unknown Chills []   Yes [x]   No []   Unknown Muscle aches (myalgia) []   Yes [x]   No []   Unknown Runny nose (rhinorrhea) []   Yes [x]   No []   Unknown Sore throat []   Yes [x]   No []   Unknown Cough (new onset or worsening of chronic cough) []   Yes [x]   No []   Unknown Shortness of breath (dyspnea) []   Yes [x]   No []   Unknown Nausea or vomiting []   Yes [x]   No []   Unknown Headache []   Yes [x]   No []   Unknown Abdominal pain  []   Yes [x]   No []   Unknown Diarrhea (?3 loose/looser than normal stools/24hr period) []   Yes [x]   No []   Unknown Other, specify:  Patient risk factors: Smoker? []   Current []   Former []   Never If male, currently pregnant? []   Yes []   No  Patient Active Problem List   Diagnosis Date Noted  . Bilateral femoral hernias s/p lap hernia repair w mesh 03/29/2018 03/29/2018  . Right inguinal hernia s/p lap hernia repair w mesh 03/29/2018 03/15/2018  . Urine frequency 03/15/2018  . Encounter for well adult exam without abnormal findings 01/29/2018  . Myofascial pain 01/19/2018  . Sore throat 06/23/2017  . Acute shoulder bursitis, right 05/02/2017  . History of closed shoulder dislocation 05/02/2017  . Right shoulder pain 04/06/2017  . Acute pharyngitis 03/03/2017  . Concern about STD in male without diagnosis 12/06/2016  . Skin tag 12/06/2016    Plan:  []   High risk for COVID-19 with red flags go to ED (with CP, SOB, weak/lightheaded, or fever > 101.5). Call ahead.  []   High risk for COVID-19 but stable. Inform provider and coordinate time for [facility] visit.   []   No red flags but URI signs or symptoms okay for [facility] visit.

## 2019-05-22 ENCOUNTER — Ambulatory Visit: Payer: 59 | Admitting: Family Medicine

## 2019-05-22 ENCOUNTER — Encounter: Payer: Self-pay | Admitting: Family Medicine

## 2019-05-22 ENCOUNTER — Other Ambulatory Visit (HOSPITAL_COMMUNITY)
Admission: RE | Admit: 2019-05-22 | Discharge: 2019-05-22 | Disposition: A | Discharge status: Home / Self Care | Payer: 59 | Source: Ambulatory Visit | Attending: Family Medicine | Admitting: Family Medicine

## 2019-05-22 VITALS — BP 120/84 | HR 74 | Temp 96.8°F | Ht 74.0 in | Wt 238.2 lb

## 2019-05-22 DIAGNOSIS — D1722 Benign lipomatous neoplasm of skin and subcutaneous tissue of left arm: Secondary | ICD-10-CM | POA: Diagnosis not present

## 2019-05-22 DIAGNOSIS — Z711 Person with feared health complaint in whom no diagnosis is made: Secondary | ICD-10-CM | POA: Insufficient documentation

## 2019-05-22 LAB — HEPATIC FUNCTION PANEL
ALT: 27 U/L (ref 0–53)
AST: 20 U/L (ref 0–37)
Albumin: 4.5 g/dL (ref 3.5–5.2)
Alkaline Phosphatase: 59 U/L (ref 39–117)
Bilirubin, Direct: 0.2 mg/dL (ref 0.0–0.3)
Total Bilirubin: 0.9 mg/dL (ref 0.2–1.2)
Total Protein: 7.1 g/dL (ref 6.0–8.3)

## 2019-05-22 NOTE — Patient Instructions (Signed)
Great to see you! Best of luck with the new job! We'll be in touch with lab results.

## 2019-05-22 NOTE — Progress Notes (Signed)
Justin Andrade - 33 y.o. male MRN [id number]  Date of birth: [birth year]  Subjective Chief Complaint  Patient presents with  . STD Screening    no symptoms just wants testing/ pt has growth left arm wants to talk about getting cut out     HPI Justin Andrade is a 33 y.o. .male here today for follow up of growth on L arm.  He also would like to have STD testing.  Reports possible exposure to STD but denies any symptoms at this time including penile pain or discharge, testicular pain, lymph node enlargement, dysuria or frequent urination.    In regards to are on L arm.  He feels that this area has gotten larger.  Sometimes painful if resting arm on area.  He would like to have this removed.   ROS:  A comprehensive ROS was completed and negative except as noted per HPI  Allergies  Allergen Reactions  . Sulfa Antibiotics     Had an allergic reaction as a child. Does not remeber    Past Medical History:  Diagnosis Date  . Anxiety   . Myofascial pain     Past Surgical History:  Procedure Laterality Date  . INGUINAL HERNIA REPAIR Bilateral 03/29/2018   Procedure: LAPAROSCOPIC RIGHT AND LEFT INGUINAL HERNIA;  Surgeon: Michael Boston, MD;  Location: [facility];  Service: General;  Laterality: Bilateral;  . INSERTION OF MESH Right 03/29/2018   Procedure: INSERTION OF MESH;  Surgeon: Michael Boston, MD;  Location: [facility];  Service: General;  Laterality: Right;  . NO PAST SURGERIES      Social History   Socioeconomic History  . Marital status: Single    Spouse name: Not on file  . Number of children: 0  . Years of education: 76  . Highest education level: Not on file  Occupational History  . 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 Financial resource strain: Not on file  . Food insecurity    Worry: Not on file    Inability: Not on file  . Transportation needs    Medical: Not on file    Non-medical: Not on file  Tobacco Use  . Smoking status: Former Smoker    Packs/day: 0.50    Years:  5.00    Pack years: 2.50    Types: Cigarettes    Quit date: 01/30/2011    Years since quitting: 8.3  . Smokeless tobacco: Never Used  . Tobacco comment: states he smoked  today 03/29/18  Substance and Sexual Activity  . Alcohol use: Yes    Alcohol/week: 4.0 standard drinks    Types: 4 Cans of beer per week    Comment: 2 beersper week   . Drug use: No  . Sexual activity: Not on file  Lifestyle  . Physical activity    Days per week: Not on file    Minutes per session: Not on file  . Stress: Not on file  Relationships  . Social Herbalist on phone: Not on file    Gets together: Not on file    Attends religious service: Not on file    Active member of club or organization: Not on file    Attends meetings of clubs or organizations: Not on file    Relationship status: Not on file  Other Topics Concern  . 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 Hypertension Mother   . Depression Father     Health Maintenance  Topic Date Due  . TETANUS/TDAP  12/27/2002  . INFLUENZA VACCINE  05/11/2019  . HIV Screening  Completed    ----------------------------------------------------------------------------------------------------------------------------------------------------------------------------------------------------------------- Physical Exam BP 120/84   Pulse 74   Temp (!) 96.8 F (36 C) (Temporal)   Ht 6\' 2"  (1.88 m)   Wt 238 lb 3.2 oz (108 kg)   SpO2 97%   BMI 30.58 kg/m   Physical Exam Constitutional:      Appearance: Normal appearance.  HENT:     Head: Normocephalic and atraumatic.     Mouth/Throat:     Mouth: Mucous membranes are moist.  Eyes:     General: No scleral icterus. Cardiovascular:     Rate and Rhythm: Normal rate and regular rhythm.  Skin:    General: Skin is warm and dry.     Comments: Nodule to L arm, freely mobile.   Non-tender.  Neurological:     General: No focal deficit present.     Mental Status: He is alert.  Psychiatric:        Mood and Affect: Mood normal.        Behavior: Behavior normal.     ------------------------------------------------------------------------------------------------------------------------------------------------------------------------------------------------------------------- Assessment and Plan  Concern about STD in male without diagnosis Gc/Chlamydia/Trich/HIV/RPR testing ordered today.   Lipoma of left upper extremity Lipoma vs cyst of L forearm.  Referral placed to general surgery for excision.

## 2019-05-22 NOTE — Assessment & Plan Note (Signed)
Lipoma vs cyst of L forearm.  Referral placed to general surgery for excision.

## 2019-05-22 NOTE — Assessment & Plan Note (Signed)
Gc/Chlamydia/Trich/HIV/RPR testing ordered today.

## 2019-05-23 LAB — URINE CYTOLOGY ANCILLARY ONLY
Chlamydia: NEGATIVE
Neisseria Gonorrhea: NEGATIVE
Trichomonas: NEGATIVE

## 2019-05-23 LAB — HIV ANTIBODY (ROUTINE TESTING W REFLEX): HIV 1&2 Ab, 4th Generation: NONREACTIVE

## 2019-05-23 LAB — RPR: RPR Ser Ql: NONREACTIVE

## 2019-07-29 ENCOUNTER — Ambulatory Visit: Payer: Self-pay | Admitting: Surgery

## 2019-07-29 NOTE — H&P (Signed)
Justin Andrade Documented: 07/29/2019 2:51 PM Location: [facility] Patient #: [id number] DOB: [birth year] Single / Language: Justin Andrade / Race: White Male  History of Present Illness Justin Hector MD; 07/29/2019 5:20 PM) The patient is a 33 year old male who presents with a soft tissue mass. Note for "Soft tissue mass": ` ` ` Patient sent for surgical consultation at the request of Dr Zigmund Daniel  Chief Complaint: Mass of left arm. ` ` The patient is a pleasant healthy male. I had fixed inguinal herniorrhaphies on him last year. He is recovering well from that. However he is noted mass on his left mid forearm. He thinks it's perinephric couple years. It is definitely gotten larger. His become more unsightly. Occasionally bothersome. Never become red swollen or tender. No lumps or bumps elsewhere. He mentioned his primary care physician. Surgical consultation offered. He denies any history infections or abscesses. No night sweats. No fevers or chills. No history of fall or trauma or foreign body. He does not smoke. He is not diabetic  (Review of systems as stated in this history (HPI) or in the review of systems. Otherwise all other 12 point ROS are negative) ` ` `   Allergies (Sabrina Canty, CMA; 07/29/2019 2:55 PM) Sulfa Antibiotics Allergies Reconciled  Medication History (Sabrina Canty, CMA; 07/29/2019 2:55 PM) No Current Medications Medications Reconciled    Vitals (Sabrina Canty CMA; 07/29/2019 2:56 PM) 07/29/2019 2:55 PM Weight: 241.5 lb Height: 74in Body Surface Area: 2.35 m Body Mass Index: 31.01 kg/m  Temp.: 53F(Temporal)  Pulse: 77 (Regular)  BP: 154/92 (Sitting, Left Arm, Standard)        Physical Exam Justin Hector MD; 07/29/2019 3:41 PM)  General Mental Status-Alert. General Appearance-Not in acute distress, Not Sickly. Orientation-Oriented X3. Hydration-Well hydrated. Voice-Normal.   Integumentary Global Assessment Upon inspection and palpation of skin surfaces of the - Axillae: non-tender, no inflammation or ulceration, no drainage. and Distribution of scalp and body hair is normal. General Characteristics Overall examination of the patient's skin reveals - no rashes and no suspicious lesions. Temperature - normal warmth is noted.  Head and Neck Head-normocephalic, atraumatic with no lesions or palpable masses. Face Global Assessment - atraumatic, no absence of expression. Neck Global Assessment - no abnormal movements, no bruit auscultated on the right, no bruit auscultated on the left, no decreased range of motion, non-tender. Trachea-midline. Thyroid Gland Characteristics - non-tender.  Eye Eyeball - Left-Extraocular movements intact, No Nystagmus - Left. Eyeball - Right-Extraocular movements intact, No Nystagmus - Right. Cornea - Left-No Hazy - Left. Cornea - Right-No Hazy - Right. Upper Eyelid - Left-No Cyanotic - Left. Upper Eyelid - Right-No Cyanotic - Right. Sclera/Conjunctiva - Left-No scleral icterus, No Discharge - Left. Sclera/Conjunctiva - Right-No scleral icterus, No Discharge - Right. Pupil - Left-Direct reaction to light normal. Pupil - Right-Direct reaction to light normal.  ENMT Ears Pinna - Left - no drainage observed, no generalized tenderness observed. Pinna - Right - no drainage observed, no generalized tenderness observed. Nose and Sinuses External Inspection of the Nose - no destructive lesion observed. Inspection of the nares - Left - quiet respiration. Inspection of the nares - Right - quiet respiration. Mouth and Throat Lips - Upper Lip - no fissures observed, no pallor noted. Lower Lip - no fissures observed, no pallor noted. Nasopharynx - no discharge present. Oral Cavity/Oropharynx - Tongue - no dryness observed. Oral Mucosa - no cyanosis observed. Hypopharynx - no evidence of airway distress observed.   Chest  and Lung Exam Inspection Movements - Normal and Symmetrical. Accessory muscles - No use of accessory muscles in breathing. Palpation Palpation of the chest reveals - Non-tender. Auscultation Breath sounds - Normal and Clear.  Cardiovascular Auscultation Rhythm - Regular. Murmurs & Other Heart Sounds - Auscultation of the heart reveals - No Murmurs and No Systolic Clicks.  Abdomen Inspection Inspection of the abdomen reveals - No Visible peristalsis and No Abnormal pulsations. Umbilicus - No Bleeding, No Urine drainage. Palpation/Percussion Palpation and Percussion of the abdomen reveal - Soft, Non Tender, No Rebound tenderness, No Rigidity (guarding) and No Cutaneous hyperesthesia. Note: Abdomen soft. Nontender. Not distended. No umbilical or incisional hernias. No guarding.  Male Genitourinary Sexual Maturity Tanner 5 - Adult hair pattern and Adult penile size and shape.  Peripheral Vascular Upper Extremity Inspection - Left - No Cyanotic nailbeds - Left, Not Gangrenous, Not Ischemic, No Petechiae. Inspection - Right - No Cyanotic nailbeds - Right, Not Gangrenous, Not Ischemic, No Petechiae.  Neurologic Neurologic evaluation reveals -normal attention span and ability to concentrate, able to name objects and repeat phrases. Appropriate fund of knowledge , normal sensation and normal coordination. Mental Status Affect - not angry, not paranoid. Cranial Nerves-Normal Bilaterally. Gait-Normal.  Neuropsychiatric Mental status exam performed with findings of-able to articulate well with normal speech/language, rate, volume and coherence, thought content normal with ability to perform basic computations and apply abstract reasoning and no evidence of hallucinations, delusions, obsessions or homicidal/suicidal ideation.  Musculoskeletal Global Assessment Spine, Ribs and Pelvis - no instability, subluxation or laxity. Right Upper Extremity - no instability,  subluxation or laxity. Note: 3 x 2 cm ellipsoid mass on the volar surface of left anterior mid forearm. Mobile. Certainly not fixed deeply  Lymphatic General Lymphatics Description - No Generalized lymphadenopathy. Head & Neck  General Head & Neck Lymphatics: Bilateral - Description - No Localized lymphadenopathy. Axillary  General Axillary Region: Bilateral - Description - No Localized lymphadenopathy. Femoral & Inguinal  Generalized Femoral & Inguinal Lymphatics: Left - Description - No Localized lymphadenopathy. Right - Description - No Localized lymphadenopathy.    Assessment & Plan Justin Hector MD; 07/29/2019 3:44 PM)  SUBCUTANEOUS MASS OF LEFT FOREARM (R22.32) Impression: Slowly growing mass of left anterior forearm. Most likely just a lipoma or another benign lesion. However it has gotten larger.  I recommended observation versus removal. Because it is getting larger and more unsightly, he's lean towards removal. We'll plan to do under local anesthesia or at least some monitored sedation at an outpatient surgery center  Current Plans You are being scheduled for surgery- Our schedulers will call you.  You should hear from our office's scheduling department within 5 working days about the location, date, and time of surgery. We try to make accommodations for patient's preferences in scheduling surgery, but sometimes the OR schedule or the surgeon's schedule prevents [place] from making those accommodations.  If you have not heard from our office [phone]) in 5 working days, call the office and ask for your surgeon's nurse.  If you have other questions about your diagnosis, plan, or surgery, call the office and ask for your surgeon's nurse.  The pathophysiology of skin & subcutaneous masses was discussed. Natural history risks without surgery were discussed. I recommended surgery to remove the mass. I explained the technique of removal with use of local anesthesia &  possible need for more aggressive sedation/anesthesia for patient comfort.  Risks such as bleeding, infection, wound breakdown, heart attack, death, and other risks were  discussed. I noted a good likelihood this will help address the problem. Possibility that this will not correct all symptoms was explained. Possibility of regrowth/recurrence of the mass was discussed. We will work to minimize complications. Questions were answered. The patient expresses understanding & wishes to proceed with surgery.  Justin Hector, MD, FACS, MASCRS Gastrointestinal and Minimally Invasive Surgery  [facility] [address] [place], [place] [address] [phone] Main / Paging [phone] Fax   '

## 2019-09-17 ENCOUNTER — Telehealth: Payer: Self-pay | Admitting: Family Medicine

## 2019-09-17 NOTE — Telephone Encounter (Signed)
Yes, please clarify request for CT scan.  No recent discussion regarding him needing imaging.

## 2019-09-17 NOTE — Telephone Encounter (Signed)
CM-Pt LM stating that would like a referral for a CT scan/I didn't know if you already had some idea about this? But I did LMOVM for pt to call back with details and may also need a VV to discuss if have not discussed already/thx dmf

## 2019-09-17 NOTE — Telephone Encounter (Signed)
Copied from [place] [id number]. Topic: General - Other >> Sep 17, 2019 12:25 PM Keene Breath wrote: Reason for CRM: Patient called to ask for a referral for a CT scan.  Please call patient to discuss at [phone]

## 2019-09-20 ENCOUNTER — Telehealth (INDEPENDENT_AMBULATORY_CARE_PROVIDER_SITE_OTHER): Payer: Managed Care, Other (non HMO) | Admitting: Family Medicine

## 2019-09-20 ENCOUNTER — Encounter: Payer: Self-pay | Admitting: Family Medicine

## 2019-09-20 VITALS — Temp 98.6°F | Ht 74.0 in | Wt 245.0 lb

## 2019-09-20 DIAGNOSIS — R0602 Shortness of breath: Secondary | ICD-10-CM | POA: Diagnosis not present

## 2019-09-20 DIAGNOSIS — R0789 Other chest pain: Secondary | ICD-10-CM | POA: Diagnosis not present

## 2019-09-20 NOTE — Assessment & Plan Note (Signed)
Discussed recommendations to proceed with CXR for initial evaluation however he does not feel like that would satisfactorily ease his concerns and he doesn't want to pay for two imaging tests.  He would prefer to bypass his insurance and pay out of pocket.   He is aware of risks related to CT and higher radiation dose and would like to proceed.  CT chest w/o contrast ordered will provide results once report returns.

## 2019-09-20 NOTE — Progress Notes (Signed)
Justin Andrade - 33 y.o. male MRN [id number]  Date of birth: Mar 03, 1986   This visit type was conducted due to national recommendations for restrictions regarding the COVID-19 Pandemic (e.g. social distancing).  This format is felt to be most appropriate for this patient at this time.  All issues noted in this document were discussed and addressed.  No physical exam was performed (except for noted visual exam findings with Video Visits).  I discussed the limitations of evaluation and management by telemedicine and the availability of in person appointments. The patient expressed understanding and agreed to proceed.  I connected with@ on 09/20/19 at  3:00 PM EST by a video enabled telemedicine application and verified that I am speaking with the correct person using two identifiers.  Present at visit: Luetta Nutting, [place]   Patient Location: [place] [address]   Provider location:   [place]  Chief Complaint  Patient presents with  . Discuss  getting a CT Scan for lungs. Smoked for years,    Pt c/o smoking for many years on and off.  Sometimes he feels pressure in lt lung area, x 1year.    HPI  Justin Andrade is a 33 y.o. male who presents via audio/video conferencing for a telehealth visit today.  He reports feeling of tightness, shortness of breath and sensation of pressure when he takes a deep breath.  He is concerned because he had a friend who is around his same age recently diagnosed with lung cancer.  He denies chest pain, cough, wheezing, weight change.  Shortness of breath, is non exertional.  He denies anginal symptoms.  He has been a smoker off and on for several years smoking 0.5-1ppd.     ROS:  A comprehensive ROS was completed and negative except as noted per HPI  Past Medical History:  Diagnosis Date  . Anxiety   . Myofascial pain     Past Surgical History:  Procedure Laterality Date  . INGUINAL HERNIA REPAIR  Bilateral 03/29/2018   Procedure: LAPAROSCOPIC RIGHT AND LEFT INGUINAL HERNIA;  Surgeon: Michael Boston, MD;  Location: [facility];  Service: General;  Laterality: Bilateral;  . INSERTION OF MESH Right 03/29/2018   Procedure: INSERTION OF MESH;  Surgeon: Michael Boston, MD;  Location: [facility];  Service: General;  Laterality: Right;  . NO PAST SURGERIES      Family History  Problem Relation Age of Onset  . Hypertension Mother   . Depression Father     Social History   Socioeconomic History  . Marital status: Single    Spouse name: Not on file  . Number of children: 0  . Years of education: 43  . Highest education level: Not on file  Occupational History  . Occupation: Lab [employer]  . Smoking status: Former Smoker    Packs/day: 0.50    Years: 5.00    Pack years: 2.50    Types: Cigarettes    Quit date: 01/30/2011    Years since quitting: 8.6  . Smokeless tobacco: Never Used  . Tobacco comment: states he smoked  today 03/29/18  Substance and Sexual Activity  . Alcohol use: Yes    Alcohol/week: 4.0 standard drinks    Types: 4 Cans of beer per week    Comment: 2 beersper week   . Drug use: No  . Sexual activity: Not on file  Other Topics Concern  . Not on file  Social History Narrative  Lab Tech at [employer]   Fun/Hobby: Dodgeball, treadmill, play video games   Social Determinants of Health   Financial Resource Strain:   . Difficulty of Paying Living Expenses: Not on file  Food Insecurity:   . Worried About Charity fundraiser in the Last Year: Not on file  . Ran Out of Food in the Last Year: Not on file  Transportation Needs:   . Lack of Transportation (Medical): Not on file  . Lack of Transportation (Non-Medical): Not on file  Physical Activity:   . Days of Exercise per Week: Not on file  . Minutes of Exercise per Session: Not on file  Stress:   . Feeling of Stress : Not on file  Social Connections:   . Frequency of Communication with Friends and Family: Not on file   . Frequency of Social Gatherings with Friends and Family: Not on file  . Attends Religious Services: Not on file  . Active Member of Clubs or Organizations: Not on file  . Attends Archivist Meetings: Not on file  . Marital Status: Not on file  Intimate Partner Violence:   . Fear of Current or Ex-Partner: Not on file  . Emotionally Abused: Not on file  . Physically Abused: Not on file  . Sexually Abused: Not on file     Current Outpatient Medications:  .  Ascorbic Acid (VITAMIN C PO), Take 1 tablet by mouth daily., Disp: , Rfl:  .  ferrous sulfate 325 (65 FE) MG EC tablet, Take 325 mg by mouth daily., Disp: , Rfl:  .  ibuprofen (ADVIL,MOTRIN) 200 MG tablet, Take 800 mg by mouth daily as needed for moderate pain., Disp: , Rfl:  .  Multiple Vitamin (MULTIVITAMIN) tablet, Take 1 tablet by mouth daily., Disp: , Rfl:  .  naproxen (NAPROSYN) 500 MG tablet, Take 1 tablet (500 mg total) by mouth 2 (two) times daily with a meal. (Patient not taking: Reported on 09/20/2019), Disp: 40 tablet, Rfl: 1 .  Omega-3 Fatty Acids (FISH OIL) 1000 MG CAPS, Take 1,000 mg by mouth daily., Disp: , Rfl:   EXAM:  VITALS per patient if applicable: Temp 0000000 F (37 C) (Temporal)   Ht 6\' 2"  (1.88 m)   Wt 245 lb (111.1 kg)   BMI 31.46 kg/m   GENERAL: alert, oriented, appears well and in no acute distress  HEENT: atraumatic, conjunttiva clear, no obvious abnormalities on inspection of external nose and ears  NECK: normal movements of the head and neck  LUNGS: on inspection no signs of respiratory distress, breathing rate appears normal, no obvious gross SOB, gasping or wheezing  CV: no obvious cyanosis  MS: moves all visible extremities without noticeable abnormality  PSYCH/NEURO: pleasant and cooperative, no obvious depression or anxiety, speech and thought processing grossly intact  ASSESSMENT AND PLAN:  Discussed the following assessment and plan:  Shortness of breath Discussed  recommendations to proceed with CXR for initial evaluation however he does not feel like that would satisfactorily ease his concerns and he doesn't want to pay for two imaging tests.  He would prefer to bypass his insurance and pay out of pocket.   He is aware of risks related to CT and higher radiation dose and would like to proceed.  CT chest w/o contrast ordered will provide results once report returns.        I discussed the assessment and treatment plan with the patient. The patient was provided an opportunity to ask questions and all  were answered. The patient agreed with the plan and demonstrated an understanding of the instructions.   The patient was advised to call back or seek an in-person evaluation if the symptoms worsen or if the condition fails to improve as anticipated.    Luetta Nutting, DO

## 2019-09-25 ENCOUNTER — Telehealth: Payer: Self-pay | Admitting: Family Medicine

## 2019-09-25 NOTE — Telephone Encounter (Signed)
Pt sent in message:  Appointment Request From: Justin Andrade    With Provider: Luetta Nutting, DO [[facility]]    Preferred Date Range: From 09/25/2019 To 10/10/2019    Preferred Times: Monday Afternoon, Tuesday Afternoon, Wednesday Afternoon, Thursday Afternoon, Friday Afternoon    Reason: To address the following health maintenance concerns.  Tetanus/Tdap  Influenza Vaccine    Comments:  Schedule me a Tetanus and Flu Shot   I know the flu shot is ok to schedule but what about the Tetanus?

## 2019-09-25 NOTE — Telephone Encounter (Signed)
Ok to schedule as a nurse visit for updated Tdap and flu vaccine

## 2019-10-02 ENCOUNTER — Other Ambulatory Visit: Payer: Managed Care, Other (non HMO)

## 2019-10-02 ENCOUNTER — Ambulatory Visit
Admission: RE | Admit: 2019-10-02 | Discharge: 2019-10-02 | Disposition: A | Discharge status: Home / Self Care | Payer: Self-pay | Source: Ambulatory Visit | Attending: Family Medicine | Admitting: Family Medicine

## 2019-10-02 ENCOUNTER — Other Ambulatory Visit: Payer: Self-pay | Admitting: Family Medicine

## 2019-10-02 ENCOUNTER — Other Ambulatory Visit: Payer: Self-pay

## 2019-10-02 DIAGNOSIS — R0789 Other chest pain: Secondary | ICD-10-CM

## 2019-10-02 DIAGNOSIS — R0602 Shortness of breath: Secondary | ICD-10-CM

## 2019-10-10 ENCOUNTER — Other Ambulatory Visit: Payer: Self-pay

## 2019-12-17 ENCOUNTER — Other Ambulatory Visit: Payer: Self-pay

## 2019-12-18 ENCOUNTER — Encounter: Payer: Self-pay | Admitting: Family Medicine

## 2019-12-18 ENCOUNTER — Ambulatory Visit (INDEPENDENT_AMBULATORY_CARE_PROVIDER_SITE_OTHER): Payer: Managed Care, Other (non HMO) | Admitting: Family Medicine

## 2019-12-18 VITALS — BP 122/81 | HR 85 | Temp 97.6°F | Ht 74.0 in | Wt 236.8 lb

## 2019-12-18 DIAGNOSIS — H9202 Otalgia, left ear: Secondary | ICD-10-CM | POA: Diagnosis not present

## 2019-12-18 DIAGNOSIS — J302 Other seasonal allergic rhinitis: Secondary | ICD-10-CM

## 2019-12-18 NOTE — Progress Notes (Signed)
Justin Andrade is a 34 y.o. male  Chief Complaint  Patient presents with  . Ear Pain    Pt c/o lt ear pain x 4 days ago. Pt has a hx of ear infections.  He said he just woke up with ear pain.    HPI: Justin Andrade is a 35 y.o. male is a former pt of Dr. Zigmund Daniel who complains of of 4 day h/o Lt ear pain. No runny nose, stuffy nose, headache. No drainage from ear. No change in hearing. No fever, chills.  He has not taken anything for the pain.  He does have a h/o seasonal allergies. He had 1-2 ear infections as a child.  He got dose #1 of Pfizer vaccine this AM.  Past Medical History:  Diagnosis Date  . Anxiety   . Myofascial pain     Past Surgical History:  Procedure Laterality Date  . INGUINAL HERNIA REPAIR Bilateral 03/29/2018   Procedure: LAPAROSCOPIC RIGHT AND LEFT INGUINAL HERNIA;  Surgeon: Michael Boston, MD;  Location: [facility];  Service: General;  Laterality: Bilateral;  . INSERTION OF MESH Right 03/29/2018   Procedure: INSERTION OF MESH;  Surgeon: Michael Boston, MD;  Location: [facility];  Service: General;  Laterality: Right;  . NO PAST SURGERIES      Social History   Socioeconomic History  . Marital status: Single    Spouse name: Not on file  . Number of children: 0  . Years of education: 15  . Highest education level: Not on file  Occupational History  . Occupation: [employer]  . Smoking status: Former Smoker    Packs/day: 0.50    Years: 5.00    Pack years: 2.50    Types: Cigarettes    Quit date: 01/30/2011    Years since quitting: 8.8  . Smokeless tobacco: Never Used  . Tobacco comment: states he smoked  today 03/29/18  Substance and Sexual Activity  . Alcohol use: Yes    Alcohol/week: 4.0 standard drinks    Types: 4 Cans of beer per week    Comment: 2 beersper week   . Drug use: No  . Sexual activity: Not on file  Other Topics Concern  . 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 Difficulty of Paying Living Expenses: Not on file  Food Insecurity:   . Worried About Charity fundraiser in the Last Year: Not on file  . Ran Out of Food in the Last Year: Not on file  Transportation Needs:   . Lack of Transportation (Medical): Not on file  . Lack of Transportation (Non-Medical): Not on file  Physical Activity:   . Days of Exercise per Week: Not on file  . Minutes of Exercise per Session: Not on file  Stress:   . Feeling of Stress : Not on file  Social Connections:   . Frequency of Communication with Friends and Family: Not on file  . Frequency of Social Gatherings with Friends and Family: Not on file  . Attends Religious Services: Not on file  . Active Member of Clubs or Organizations: Not on file  . Attends Archivist Meetings: Not on file  . Marital Status: Not on file  Intimate Partner Violence:   . Fear of Current or Ex-Partner: Not on file  . Emotionally Abused:  Not on file  . Physically Abused: Not on file  . Sexually Abused: Not on file    Family History  Problem Relation Age of Onset  . Hypertension Mother   . Depression Father       There is no immunization history on file for this patient.  Outpatient Encounter Medications as of 12/18/2019  Medication Sig  . Ascorbic Acid (VITAMIN C PO) Take 1 tablet by mouth daily.  . ferrous sulfate 325 (65 FE) MG EC tablet Take 325 mg by mouth daily.  Marland Kitchen ibuprofen (ADVIL,MOTRIN) 200 MG tablet Take 800 mg by mouth daily as needed for moderate pain.  . Multiple Vitamin (MULTIVITAMIN) tablet Take 1 tablet by mouth daily.  . Omega-3 Fatty Acids (FISH OIL) 1000 MG CAPS Take 1,000 mg by mouth daily.  . naproxen (NAPROSYN) 500 MG tablet Take 1 tablet (500 mg total) by mouth 2 (two) times daily with a meal. (Patient not taking: Reported on 09/20/2019)   No facility-administered encounter medications on file as of 12/18/2019.      ROS: Pertinent positives and negatives noted in HPI. Remainder of ROS non-contributory    Allergies  Allergen Reactions  . Sulfa Antibiotics     Had an allergic reaction as a child. Does not remeber    BP 122/81 (BP Location: Left Arm, Patient Position: Sitting, Cuff Size: Normal)   Pulse 85   Temp 97.6 F (36.4 C) (Temporal)   Ht 6\' 2"  (1.88 m)   Wt 236 lb 12.8 oz (107.4 kg)   SpO2 97%   BMI 30.40 kg/m   Physical Exam  Constitutional: He is oriented to person, place, and time. He appears well-developed and well-nourished.  HENT:  Right Ear: Hearing, tympanic membrane, external ear and ear canal normal. No drainage, swelling or tenderness. Tympanic membrane is not erythematous and not bulging.  Left Ear: Hearing and ear canal normal. No drainage, swelling or tenderness. Tympanic membrane is not erythematous and not bulging. A middle ear effusion (mild serious effusion noted) is present.  Neurological: He is alert and oriented to person, place, and time.  Psychiatric: He has a normal mood and affect. His behavior is normal.     A/P:  1. Seasonal allergic rhinitis, unspecified trigger 2. Ear pain, left - no evidence of infection, cerumen impaction. Pt with small serous effusion Lt ear - daily claritin or zyrtec along with daily inhaled corticosteroid (flonase) and nasal saline spray 2-3x/day - f/u PRN Discussed plan and reviewed medications with patient, including risks, benefits, and potential side effects. Pt expressed understand. All questions answered.   This visit occurred during the SARS-CoV-2 public health emergency.  Safety protocols were in place, including screening questions prior to the visit, additional usage of staff PPE, and extensive cleaning of exam room while observing appropriate contact time as indicated for disinfecting solutions.

## 2020-04-23 ENCOUNTER — Other Ambulatory Visit: Payer: Self-pay

## 2020-04-23 ENCOUNTER — Encounter: Payer: Self-pay | Admitting: Family Medicine

## 2020-04-23 ENCOUNTER — Other Ambulatory Visit (HOSPITAL_COMMUNITY)
Admission: RE | Admit: 2020-04-23 | Discharge: 2020-04-23 | Disposition: A | Discharge status: Home / Self Care | Payer: Managed Care, Other (non HMO) | Source: Ambulatory Visit | Attending: Family Medicine | Admitting: Family Medicine

## 2020-04-23 ENCOUNTER — Ambulatory Visit (INDEPENDENT_AMBULATORY_CARE_PROVIDER_SITE_OTHER): Payer: Managed Care, Other (non HMO) | Admitting: Family Medicine

## 2020-04-23 VITALS — BP 128/90 | HR 58 | Temp 97.9°F | Ht 74.0 in | Wt 235.4 lb

## 2020-04-23 DIAGNOSIS — K219 Gastro-esophageal reflux disease without esophagitis: Secondary | ICD-10-CM

## 2020-04-23 DIAGNOSIS — Z113 Encounter for screening for infections with a predominantly sexual mode of transmission: Secondary | ICD-10-CM | POA: Insufficient documentation

## 2020-04-23 DIAGNOSIS — Z1283 Encounter for screening for malignant neoplasm of skin: Secondary | ICD-10-CM

## 2020-04-23 DIAGNOSIS — A630 Anogenital (venereal) warts: Secondary | ICD-10-CM | POA: Diagnosis not present

## 2020-04-23 DIAGNOSIS — L709 Acne, unspecified: Secondary | ICD-10-CM | POA: Diagnosis not present

## 2020-04-23 MED ORDER — IMIQUIMOD 5 % EX CREA: TOPICAL_CREAM | CUTANEOUS | 1 refills | Status: DC

## 2020-04-23 NOTE — Progress Notes (Signed)
Justin Andrade is a 34 y.o. male  Chief Complaint  Patient presents with   Acute Visit    Pt c/o genital ward there x 2weeks.  Pt aslo would like to discuss acid reflux that has worened x 2week.  Pt also would like a referral for GI and maybe dematology    HPI: Justin Andrade is a 34 y.o. male  1. "wart" on his penis x 2 wks. Not painful/tender/itch. No drainage. No urethral discharge. Nothing like this in the past. Would like full STI screening. 2. Referral to derm - routine skin check, pt with h/o lipomas, acne 3. Increased reflux/heartburn symptoms x 2 wks (symptoms x months/years) and requesting referral to GI. He complains of symptoms at least 1-2x/wk. He has not taken anything for it. He has stopped eating raw onions and decreased the amount of tomatoes he eats. Coffee may also be a trigger but he has not cut back.   Past Medical History:  Diagnosis Date   Anxiety    Myofascial pain     Past Surgical History:  Procedure Laterality Date   INGUINAL HERNIA REPAIR Bilateral 03/29/2018   Procedure: LAPAROSCOPIC RIGHT AND LEFT INGUINAL HERNIA;  Surgeon: Michael Boston, MD;  Location: [facility];  Service: General;  Laterality: Bilateral;   INSERTION OF MESH Right 03/29/2018   Procedure: INSERTION OF MESH;  Surgeon: Michael Boston, MD;  Location: [facility];  Service: General;  Laterality: Right;   NO PAST SURGERIES      Social History   Socioeconomic History   Marital status: Single    Spouse name: Not on file   Number of children: 0   Years of education: 16   Highest education level: Not on file  Occupational History   Occupation: Lab Tech  Tobacco Use   Smoking status: Former Smoker    Packs/day: 0.50    Years: 5.00    Pack years: 2.50    Types: Cigarettes    Quit date: 01/30/2011    Years since quitting: 9.2   Smokeless tobacco: Never Used   Tobacco comment: states he smoked  today 03/29/18  Vaping Use   Vaping Use: Former  Substance and Sexual Activity    Alcohol use: Yes    Alcohol/week: 4.0 standard drinks    Types: 4 Cans of beer per week    Comment: 2 beersper week    Drug use: No   Sexual activity: Not on file  Other Topics Concern   Not on file  Social History Narrative   Lab Tech at [employer]   Fun/Hobby: Dodgeball, treadmill, play video games   Social Determinants of Health   Financial Resource Strain:    Difficulty of Paying Living Expenses:   Food Insecurity:    Worried About Charity fundraiser in the Last Year:    Arboriculturist in the Last Year:   Transportation Needs:    Film/video editor (Medical):    Lack of Transportation (Non-Medical):   Physical Activity:    Days of Exercise per Week:    Minutes of Exercise per Session:   Stress:    Feeling of Stress :   Social Connections:    Frequency of Communication with Friends and Family:    Frequency of Social Gatherings with Friends and Family:    Attends Religious Services:    Active Member of Clubs or Organizations:    Attends Archivist Meetings:    Marital Status:   Intimate Partner Violence:  Fear of Current or Ex-Partner:    Emotionally Abused:    Physically Abused:    Sexually Abused:     Family History  Problem Relation Age of Onset   Hypertension Mother    Depression Father       There is no immunization history on file for this patient.  Outpatient Encounter Medications as of 04/23/2020  Medication Sig   Multiple Vitamin (MULTIVITAMIN) tablet Take 1 tablet by mouth daily.    ferrous sulfate 325 (65 FE) MG EC tablet Take 325 mg by mouth daily.   [START ON 04/24/2020] imiquimod (ALDARA) 5 % cream Apply topically 3 (three) times a week.   [DISCONTINUED] Ascorbic Acid (VITAMIN C PO) Take 1 tablet by mouth daily. (Patient not taking: Reported on 04/23/2020)   [DISCONTINUED] ibuprofen (ADVIL,MOTRIN) 200 MG tablet Take 800 mg by mouth daily as needed for moderate pain. (Patient not taking: Reported on 04/23/2020)     [DISCONTINUED] naproxen (NAPROSYN) 500 MG tablet Take 1 tablet (500 mg total) by mouth 2 (two) times daily with a meal. (Patient not taking: Reported on 09/20/2019)   [DISCONTINUED] Omega-3 Fatty Acids (FISH OIL) 1000 MG CAPS Take 1,000 mg by mouth daily. (Patient not taking: Reported on 04/23/2020)   No facility-administered encounter medications on file as of 04/23/2020.     ROS: Pertinent positives and negatives noted in HPI. Remainder of ROS non-contributory   Allergies  Allergen Reactions   Sulfa Antibiotics     Had an allergic reaction as a child. Does not remeber    BP 128/90 (BP Location: Left Arm, Patient Position: Sitting, Cuff Size: Normal)    Pulse (!) 58    Temp 97.9 F (36.6 C) (Temporal)    Ht 6\' 2"  (1.88 m)    Wt 235 lb 6.4 oz (106.8 kg)    SpO2 97%    BMI 30.22 kg/m   Physical Exam Constitutional:      Appearance: Normal appearance. He is not ill-appearing.  Pulmonary:     Effort: No respiratory distress.  Genitourinary:    Penis: Circumcised. Lesions (solitary, flesh-colored papule on ventral aspect of penis; non-tender) present.   Neurological:     General: No focal deficit present.     Mental Status: He is alert and oriented to person, place, and time.      A/P:  1. Routine screening for STI (sexually transmitted infection) - HIV Antibody (routine testing w rflx) - Hepatitis B surface antigen - RPR - Hepatitis C Antibody - Urine cytology ancillary only([place])  2. Gastroesophageal reflux disease, unspecified whether esophagitis present - identify and limit/avoid trigger foods - trial of Tums or OTC like pepcid. If no/minimal improvement, recommend 2 wk trial of omeprazole 20mg  daily  3. Acne, unspecified acne type 4. Skin cancer screening - Ambulatory referral to Dermatology  5. Genital warts - single wart, present x 2 wks Rx: - imiquimod (ALDARA) 5 % cream; Apply topically 3 (three) times a week.  Dispense: 12 each; Refill: 1 -  Ambulatory referral to Dermatology  This visit occurred during the SARS-CoV-2 public health emergency.  Safety protocols were in place, including screening questions prior to the visit, additional usage of staff PPE, and extensive cleaning of exam room while observing appropriate contact time as indicated for disinfecting solutions.

## 2020-04-23 NOTE — Patient Instructions (Signed)
Identify trigger foods and avoid them Try OTC Tums when needed You can also try pepcid ahead of a meal with likely trigger foods If symptoms increase, try 2 wks of omeprazole (prilosec) 20mg  1 tab daily in AM   Food Choices for Gastroesophageal Reflux Disease, Adult When you have gastroesophageal reflux disease (GERD), the foods you eat and your eating habits are very important. Choosing the right foods can help ease your discomfort. Think about working with a nutrition specialist (dietitian) to help you make good choices. What are tips for following this plan?  Meals  Choose healthy foods that are low in fat, such as fruits, vegetables, whole grains, low-fat dairy products, and lean meat, fish, and poultry.  Eat small meals often instead of 3 large meals a day. Eat your meals slowly, and in a place where you are relaxed. Avoid bending over or lying down until 2-3 hours after eating.  Avoid eating meals 2-3 hours before bed.  Avoid drinking a lot of liquid with meals.  Cook foods using methods other than frying. Bake, grill, or broil food instead.  Avoid or limit: ? Chocolate. ? Peppermint or spearmint. ? Alcohol. ? Pepper. ? Black and decaffeinated coffee. ? Black and decaffeinated tea. ? Bubbly (carbonated) soft drinks. ? Caffeinated energy drinks and soft drinks.  Limit high-fat foods such as: ? Fatty meat or fried foods. ? Whole milk, cream, butter, or ice cream. ? Nuts and nut butters. ? Pastries, donuts, and sweets made with butter or shortening.  Avoid foods that cause symptoms. These foods may be different for everyone. Common foods that cause symptoms include: ? Tomatoes. ? Oranges, lemons, and limes. ? Peppers. ? Spicy food. ? Onions and garlic. ? Vinegar. Lifestyle  Maintain a healthy weight. Ask your doctor what weight is healthy for you. If you need to lose weight, work with your doctor to do so safely.  Exercise for at least 30 minutes for 5 or more days  each week, or as told by your doctor.  Wear loose-fitting clothes.  Do not smoke. If you need help quitting, ask your doctor.  Sleep with the head of your bed higher than your feet. Use a wedge under the mattress or blocks under the bed frame to raise the head of the bed. Summary  When you have gastroesophageal reflux disease (GERD), food and lifestyle choices are very important in easing your symptoms.  Eat small meals often instead of 3 large meals a day. Eat your meals slowly, and in a place where you are relaxed.  Limit high-fat foods such as fatty meat or fried foods.  Avoid bending over or lying down until 2-3 hours after eating.  Avoid peppermint and spearmint, caffeine, alcohol, and chocolate. This information is not intended to replace advice given to you by your health care provider. Make sure you discuss any questions you have with your health care provider. Document Revised: 01/17/2019 Document Reviewed: 11/01/2016 Elsevier Patient Education  [place].

## 2020-04-24 LAB — URINE CYTOLOGY ANCILLARY ONLY
Chlamydia: NEGATIVE
Comment: NEGATIVE
Comment: NORMAL
Neisseria Gonorrhea: NEGATIVE

## 2020-04-24 LAB — HIV ANTIBODY (ROUTINE TESTING W REFLEX): HIV 1&2 Ab, 4th Generation: NONREACTIVE

## 2020-04-24 LAB — HEPATITIS C ANTIBODY
Hepatitis C Ab: NONREACTIVE
SIGNAL TO CUT-OFF: 0.01 (ref <1.00)

## 2020-04-24 LAB — RPR: RPR Ser Ql: NONREACTIVE

## 2020-04-24 LAB — HEPATITIS B SURFACE ANTIGEN: Hepatitis B Surface Ag: NONREACTIVE

## 2020-10-20 ENCOUNTER — Ambulatory Visit: Payer: Managed Care, Other (non HMO) | Admitting: Dermatology

## 2020-12-31 ENCOUNTER — Ambulatory Visit: Payer: Managed Care, Other (non HMO) | Admitting: Family Medicine

## 2021-01-01 ENCOUNTER — Encounter: Payer: Self-pay | Admitting: Family Medicine

## 2021-01-01 ENCOUNTER — Ambulatory Visit (INDEPENDENT_AMBULATORY_CARE_PROVIDER_SITE_OTHER): Payer: Managed Care, Other (non HMO) | Admitting: Family Medicine

## 2021-01-01 ENCOUNTER — Other Ambulatory Visit: Payer: Self-pay

## 2021-01-01 ENCOUNTER — Other Ambulatory Visit (HOSPITAL_COMMUNITY)
Admission: RE | Admit: 2021-01-01 | Discharge: 2021-01-01 | Disposition: A | Discharge status: Home / Self Care | Payer: Managed Care, Other (non HMO) | Source: Ambulatory Visit | Attending: Family Medicine | Admitting: Family Medicine

## 2021-01-01 VITALS — BP 122/84 | HR 79 | Temp 96.8°F | Ht 74.0 in | Wt 253.2 lb

## 2021-01-01 DIAGNOSIS — M546 Pain in thoracic spine: Secondary | ICD-10-CM | POA: Diagnosis not present

## 2021-01-01 DIAGNOSIS — L709 Acne, unspecified: Secondary | ICD-10-CM | POA: Diagnosis not present

## 2021-01-01 DIAGNOSIS — M549 Dorsalgia, unspecified: Secondary | ICD-10-CM

## 2021-01-01 DIAGNOSIS — Z113 Encounter for screening for infections with a predominantly sexual mode of transmission: Secondary | ICD-10-CM | POA: Insufficient documentation

## 2021-01-01 DIAGNOSIS — F410 Panic disorder [episodic paroxysmal anxiety] without agoraphobia: Secondary | ICD-10-CM

## 2021-01-01 MED ORDER — CLINDAMYCIN PHOSPHATE 1 % EX SOLN: Freq: Two times a day (BID) | CUTANEOUS | 2 refills | Status: DC

## 2021-01-01 MED ORDER — ALPRAZOLAM 0.25 MG PO TABS: ORAL_TABLET | ORAL | 0 refills | Status: DC

## 2021-01-01 MED ORDER — CYCLOBENZAPRINE HCL 5 MG PO TABS: 5.0000 mg | ORAL_TABLET | Freq: Every day | ORAL | 0 refills | Status: DC

## 2021-01-01 NOTE — Patient Instructions (Signed)
Heating pad 2-3x/day 15-20 min on then off Ibuprofen 600mg  2x/day w/ food x 4-5 days Flexeril 5mg  1 tab at bedtime x 2-3 nigths then as needed
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
## 2021-01-04 LAB — URINE CYTOLOGY ANCILLARY ONLY
Chlamydia: NEGATIVE
Comment: NEGATIVE
Comment: NEGATIVE
Comment: NORMAL
Neisseria Gonorrhea: NEGATIVE
Trichomonas: NEGATIVE

## 2021-01-04 LAB — HEPATITIS C ANTIBODY
Hepatitis C Ab: NONREACTIVE
SIGNAL TO CUT-OFF: 0.01 (ref <1.00)

## 2021-01-04 LAB — RPR: RPR Ser Ql: NONREACTIVE

## 2021-01-04 LAB — HIV ANTIBODY (ROUTINE TESTING W REFLEX): HIV 1&2 Ab, 4th Generation: NONREACTIVE

## 2021-01-04 LAB — HEPATITIS B SURFACE ANTIGEN: Hepatitis B Surface Ag: NONREACTIVE

## 2021-01-10 ENCOUNTER — Encounter: Payer: Self-pay | Admitting: Family Medicine

## 2021-01-10 DIAGNOSIS — F410 Panic disorder [episodic paroxysmal anxiety] without agoraphobia: Secondary | ICD-10-CM

## 2021-01-11 NOTE — Telephone Encounter (Signed)
Please review patient's message and advise.  Thanks. Dm/cma

## 2021-01-13 MED ORDER — ALPRAZOLAM 0.25 MG PO TABS
ORAL_TABLET | ORAL | 1 refills | Status: DC
Start: 2021-01-13 — End: 2024-04-03

## 2021-03-17 ENCOUNTER — Encounter: Payer: Self-pay | Admitting: Family Medicine

## 2021-03-25 ENCOUNTER — Other Ambulatory Visit: Payer: Self-pay

## 2021-03-25 ENCOUNTER — Encounter: Payer: Self-pay | Admitting: Family Medicine

## 2021-03-25 ENCOUNTER — Ambulatory Visit (INDEPENDENT_AMBULATORY_CARE_PROVIDER_SITE_OTHER): Payer: Managed Care, Other (non HMO) | Admitting: Family Medicine

## 2021-03-25 VITALS — BP 120/70 | HR 84 | Temp 97.9°F | Ht 74.0 in | Wt 239.2 lb

## 2021-03-25 DIAGNOSIS — M549 Dorsalgia, unspecified: Secondary | ICD-10-CM

## 2021-03-25 NOTE — Patient Instructions (Addendum)
Heating pad 2x/day 15-60min on then off After heat, do exercises (see below) Consider ibuprofen 600mg  2x/day with food x 5-7 days    Low Back Sprain or Strain Rehab Ask your health care provider which exercises are safe for you. Do exercises exactly as told by your health care provider and adjust them as directed. It is normal to feel mild stretching, pulling, tightness, or discomfort as you do these exercises. Stop right away if you feel sudden pain or your pain gets worse. Do not begin these exercises until told by your health care provider. Stretching and range-of-motion exercises These exercises warm up your muscles and joints and improve the movement and flexibility of your back. These exercises also help to relieve pain, numbness,and tingling. Lumbar rotation  Lie on your back on a firm surface and bend your knees. Straighten your arms out to your sides so each arm forms a 90-degree angle (right angle) with a side of your body. Slowly move (rotate) both of your knees to one side of your body until you feel a stretch in your lower back (lumbar). Try not to let your shoulders lift off the floor. Hold this position for __________ seconds. Tense your abdominal muscles and slowly move your knees back to the starting position. Repeat this exercise on the other side of your body. Repeat __________ times. Complete this exercise __________ times a day. Single knee to chest  Lie on your back on a firm surface with both legs straight. Bend one of your knees. Use your hands to move your knee up toward your chest until you feel a gentle stretch in your lower back and buttock. Hold your leg in this position by holding on to the front of your knee. Keep your other leg as straight as possible. Hold this position for __________ seconds. Slowly return to the starting position. Repeat with your other leg. Repeat __________ times. Complete this exercise __________ times a day. Prone extension on  elbows  Lie on your abdomen on a firm surface (prone position). Prop yourself up on your elbows. Use your arms to help lift your chest up until you feel a gentle stretch in your abdomen and your lower back. This will place some of your body weight on your elbows. If this is uncomfortable, try stacking pillows under your chest. Your hips should stay down, against the surface that you are lying on. Keep your hip and back muscles relaxed. Hold this position for __________ seconds. Slowly relax your upper body and return to the starting position. Repeat __________ times. Complete this exercise __________ times a day. Strengthening exercises These exercises build strength and endurance in your back. Endurance is theability to use your muscles for a long time, even after they get tired. Pelvic tilt This exercise strengthens the muscles that lie deep in the abdomen. Lie on your back on a firm surface. Bend your knees and keep your feet flat on the floor. Tense your abdominal muscles. Tip your pelvis up toward the ceiling and flatten your lower back into the floor. To help with this exercise, you may place a small towel under your lower back and try to push your back into the towel. Hold this position for __________ seconds. Let your muscles relax completely before you repeat this exercise. Repeat __________ times. Complete this exercise __________ times a day. Alternating arm and leg raises  Get on your hands and knees on a firm surface. If you are on a hard floor, you may want to  use padding, such as an exercise mat, to cushion your knees. Line up your arms and legs. Your hands should be directly below your shoulders, and your knees should be directly below your hips. Lift your left leg behind you. At the same time, raise your right arm and straighten it in front of you. Do not lift your leg higher than your hip. Do not lift your arm higher than your shoulder. Keep your abdominal and back  muscles tight. Keep your hips facing the ground. Do not arch your back. Keep your balance carefully, and do not hold your breath. Hold this position for __________ seconds. Slowly return to the starting position. Repeat with your right leg and your left arm. Repeat __________ times. Complete this exercise __________ times a day. Abdominal set with straight leg raise  Lie on your back on a firm surface. Bend one of your knees and keep your other leg straight. Tense your abdominal muscles and lift your straight leg up, 4-6 inches (10-15 cm) off the ground. Keep your abdominal muscles tight and hold this position for __________ seconds. Do not hold your breath. Do not arch your back. Keep it flat against the ground. Keep your abdominal muscles tense as you slowly lower your leg back to the starting position. Repeat with your other leg. Repeat __________ times. Complete this exercise __________ times a day. Single leg lower with bent knees Lie on your back on a firm surface. Tense your abdominal muscles and lift your feet off the floor, one foot at a time, so your knees and hips are bent in 90-degree angles (right angles). Your knees should be over your hips and your lower legs should be parallel to the floor. Keeping your abdominal muscles tense and your knee bent, slowly lower one of your legs so your toe touches the ground. Lift your leg back up to return to the starting position. Do not hold your breath. Do not let your back arch. Keep your back flat against the ground. Repeat with your other leg. Repeat __________ times. Complete this exercise __________ times a day. Posture and body mechanics Good posture and healthy body mechanics can help to relieve stress in your body's tissues and joints. Body mechanics refers to the movements and positions of your body while you do your daily activities. Posture is part of body mechanics. Good posture means: Your spine is in its natural S-curve  position (neutral). Your shoulders are pulled back slightly. Your head is not tipped forward. Follow these guidelines to improve your posture and body mechanics in youreveryday activities. Standing  When standing, keep your spine neutral and your feet about hip width apart. Keep a slight bend in your knees. Your ears, shoulders, and hips should line up. When you do a task in which you stand in one place for a long time, place one foot up on a stable object that is 2-4 inches (5-10 cm) high, such as a footstool. This helps keep your spine neutral.  Sitting  When sitting, keep your spine neutral and keep your feet flat on the floor. Use a footrest, if necessary, and keep your thighs parallel to the floor. Avoid rounding your shoulders, and avoid tilting your head forward. When working at a desk or a computer, keep your desk at a height where your hands are slightly lower than your elbows. Slide your chair under your desk so you are close enough to maintain good posture. When working at a computer, place your monitor at a  height where you are looking straight ahead and you do not have to tilt your head forward or downward to look at the screen.  Resting When lying down and resting, avoid positions that are most painful for you. If you have pain with activities such as sitting, bending, stooping, or squatting, lie in a position in which your body does not bend very much. For example, avoid curling up on your side with your arms and knees near your chest (fetal position). If you have pain with activities such as standing for a long time or reaching with your arms, lie with your spine in a neutral position and bend your knees slightly. Try the following positions: Lying on your side with a pillow between your knees. Lying on your back with a pillow under your knees. Lifting  When lifting objects, keep your feet at least shoulder width apart and tighten your abdominal muscles. Bend your knees and  hips and keep your spine neutral. It is important to lift using the strength of your legs, not your back. Do not lock your knees straight out. Always ask for help to lift heavy or awkward objects.  This information is not intended to replace advice given to you by your health care provider. Make sure you discuss any questions you have with your healthcare provider. Document Revised: 01/18/2019 Document Reviewed: 10/18/2018 Elsevier Patient Education  [place].

## 2021-03-25 NOTE — Progress Notes (Signed)
Justin Andrade is a 35 y.o. male  Chief Complaint  Patient presents with   Back Pain    Pt c/o lower back pain, sharpe, x 7 week. Pt has been taking Ibuprofen for the pain.    HPI: Justin Andrade is a 35 y.o. male patient who complains of 6-7 week h/o Lt lower back pain, which began after he was white water rafting. Describes as a soreness but he also notes a  brief episodes of sharp pain when he bent forward.  He was taking ibuprofen PRN, using heat/ice at times.   He has flexeril but did not take.  No weakness, no numbness or tingling. No change in bowel or bladder function.    Past Medical History:  Diagnosis Date   Anxiety    Myofascial pain     Past Surgical History:  Procedure Laterality Date   INGUINAL HERNIA REPAIR Bilateral 03/29/2018   Procedure: LAPAROSCOPIC RIGHT AND LEFT INGUINAL HERNIA;  Surgeon: Michael Boston, MD;  Location: [facility];  Service: General;  Laterality: Bilateral;   INSERTION OF MESH Right 03/29/2018   Procedure: INSERTION OF MESH;  Surgeon: Michael Boston, MD;  Location: [facility];  Service: General;  Laterality: Right;   NO PAST SURGERIES      Social History   Socioeconomic History   Marital status: Single    Spouse name: Not on file   Number of children: 0   Years of education: 16   Highest education level: Not on file  Occupational History   Occupation: Lab Tech  Tobacco Use   Smoking status: Former    Packs/day: 0.50    Years: 5.00    Pack years: 2.50    Types: Cigarettes    Quit date: 01/30/2011    Years since quitting: 10.1   Smokeless tobacco: Never   Tobacco comments:    states he smoked  today 03/29/18  Vaping Use   Vaping Use: Former  Substance and Sexual Activity   Alcohol use: Yes    Alcohol/week: 4.0 standard drinks    Types: 4 Cans of beer per week    Comment: 2 beersper week    Drug use: No   Sexual activity: Not on file  Other Topics Concern   Not on file  Social History Narrative   Lab Tech at [employer]   Fun/Hobby:  Dodgeball, treadmill, play video games   Social Determinants of Health   Financial Resource Strain: Not on file  Food Insecurity: Not on file  Transportation Needs: Not on file  Physical Activity: Not on file  Stress: Not on file  Social Connections: Not on file  Intimate Partner Violence: Not on file    Family History  Problem Relation Age of Onset   Hypertension Mother    Depression Father       There is no immunization history on file for this patient.  Outpatient Encounter Medications as of 03/25/2021  Medication Sig   ferrous sulfate 325 (65 FE) MG EC tablet Take 325 mg by mouth daily.   imiquimod (ALDARA) 5 % cream Apply topically 3 (three) times a week.   Multiple Vitamin (MULTIVITAMIN) tablet Take 1 tablet by mouth daily.    Shampoos (CLN HEALTHY SCALP) SHAM See admin instructions.   Sunscreen SPF50 LOTN See admin instructions.   ALPRAZolam (XANAX) 0.25 MG tablet 1-2 tabs po daily PRN (Patient not taking: Reported on 03/25/2021)   clindamycin (CLEOCIN T) 1 % external solution Apply topically 2 (two) times daily.  cyclobenzaprine (FLEXERIL) 5 MG tablet Take 1 tablet (5 mg total) by mouth at bedtime. (Patient not taking: Reported on 03/25/2021)   No facility-administered encounter medications on file as of 03/25/2021.     ROS: Pertinent positives and negatives noted in HPI. Remainder of ROS non-contributory    Allergies  Allergen Reactions   Sulfa Antibiotics     Had an allergic reaction as a child. Does not remeber    BP 120/70 (BP Location: Left Arm, Patient Position: Sitting, Cuff Size: Normal)   Pulse 84   Temp 97.9 F (36.6 C) (Temporal)   Ht 6\' 2"  (1.88 m)   Wt 239 lb 3.2 oz (108.5 kg)   SpO2 98%   BMI 30.71 kg/m  Wt Readings from Last 3 Encounters:  03/25/21 239 lb 3.2 oz (108.5 kg)  01/01/21 253 lb 3.2 oz (114.9 kg)  04/23/20 235 lb 6.4 oz (106.8 kg)   Temp Readings from Last 3 Encounters:  03/25/21 97.9 F (36.6 C) (Temporal)  01/01/21 (!)  96.8 F (36 C) (Temporal)  04/23/20 97.9 F (36.6 C) (Temporal)   BP Readings from Last 3 Encounters:  03/25/21 120/70  01/01/21 122/84  04/23/20 128/90   Pulse Readings from Last 3 Encounters:  03/25/21 84  01/01/21 79  04/23/20 (!) 58     Physical Exam Constitutional:      General: He is not in acute distress.    Appearance: He is not ill-appearing.  Musculoskeletal:     Lumbar back: Tenderness present. No swelling, deformity, spasms or bony tenderness. Normal range of motion.       Back:  Neurological:     Mental Status: He is alert and oriented to person, place, and time.  Psychiatric:        Mood and Affect: Mood normal.        Behavior: Behavior normal.     A/P:  1. Musculoskeletal back pain - recommend heating pad 2x/day followed by stretching exercises - pt does not feel he needs but could consider ibuprofen 600mg  BID w/ food x 5-7 days and/or PT referral    This visit occurred during the SARS-CoV-2 public health emergency.  Safety protocols were in place, including screening questions prior to the visit, additional usage of staff PPE, and extensive cleaning of exam room while observing appropriate contact time as indicated for disinfecting solutions.

## 2023-04-17 ENCOUNTER — Encounter: Payer: Self-pay | Admitting: Family Medicine

## 2023-04-17 ENCOUNTER — Ambulatory Visit (INDEPENDENT_AMBULATORY_CARE_PROVIDER_SITE_OTHER): Payer: BC Managed Care – PPO | Admitting: Family Medicine

## 2023-04-17 VITALS — BP 138/85 | HR 74 | Temp 97.9°F | Ht 74.0 in | Wt 243.5 lb

## 2023-04-17 DIAGNOSIS — M25531 Pain in right wrist: Secondary | ICD-10-CM

## 2023-04-17 NOTE — Progress Notes (Signed)
Musculoskeletal Exam  Patient: Justin Andrade DOB: 01-16-1986  DOS: 04/17/2023  SUBJECTIVE:  Chief Complaint:   Chief Complaint  Patient presents with   New Patient (Initial Visit)    Arthritis or carpal tunnel right hand Recently treated for shingles     Justin Andrade is a 37 y.o.  male for evaluation and treatment of R wrist/hand pain.   Onset:  3 weeks ago. No inj or change in activity.  Location: base of thumb Character:  sharp, pins/needles Progression of issue:  is unchanged Associated symptoms: no swelling, redness, bruising, decreased ROM.  Treatment: to date has been ice.   Neurovascular symptoms: no  Past Medical History:  Diagnosis Date   Anxiety    Myofascial pain     Objective: VITAL SIGNS: BP 138/85 (BP Location: Left Arm, Patient Position: Sitting, Cuff Size: Normal)   Pulse 74   Temp 97.9 F (36.6 C) (Oral)   Ht 6\' 2"  (1.88 m)   Wt 243 lb 8 oz (110.5 kg)   SpO2 97%   BMI 31.26 kg/m  Constitutional: Well formed, well developed. No acute distress. Thorax & Lungs: No accessory muscle use Musculoskeletal: R wrist.   Normal active range of motion: yes.   Normal passive range of motion: yes Tenderness to palpation: Mild tenderness over the extensor tendons of the first digit Deformity: no Ecchymosis: no Neurologic: Normal sensory function. Grip strength adequate b/l.  Negative Tinel's and Phalen's. Psychiatric: Normal mood. Age appropriate judgment and insight. Alert & oriented x 3.    Assessment:  Right wrist pain  Plan: Likely a tendinitis.  He did have some concern for arthritis and carpal tunnel syndrome.  He is a bit young for arthritis and his exam and history do not support the latter.  Thumb spica splint recommended for nighttime and for irritating activities.  Stretches/exercises, heat, ice, Tylenol.  OT versus sports medicine referral if no better in the next month. F/u 9 months or so for his physical. The patient voiced understanding and  agreement to the plan.   Jilda Roche [place], DO 04/17/23  3:16 PM

## 2023-04-17 NOTE — Patient Instructions (Addendum)
Consider a thumb spica splint to wear at night and/or during aggravating activities.   Ice/cold pack over area for 10-15 min twice daily.  Heat (pad or rice pillow in microwave) over affected area, 10-15 minutes twice daily.   OK to take Tylenol 1000 mg (2 extra strength tabs) or 975 mg (3 regular strength tabs) every 6 hours as needed.  Ibuprofen 400-600 mg (2-3 over the counter strength tabs) every 6 hours as needed for pain.  Send me a message in 3-4 weeks if no improving.   Let us know if you need anything.  Wrist and Forearm Exercises Do exercises exactly as told by your health care provider and adjust them as directed. It is normal to feel mild stretching, pulling, tightness, or discomfort as you do these exercises, but you should stop right away if you feel sudden pain or your pain gets worse.   RANGE OF MOTION EXERCISES These exercises warm up your muscles and joints and improve the movement and flexibility of your injured wrist and forearm. These exercises also help to relieve pain, numbness, and tingling. These exercises are done using the muscles in your injured wrist and forearm. Exercise A: Wrist Flexion, Active With your fingers relaxed, bend your wrist forward as far as you can. Hold this position for 30 seconds. Repeat 2 times. Complete this exercise 3 times per week. Exercise B: Wrist Extension, Active With your fingers relaxed, bend your wrist backward as far as you can. Hold this position for 30 seconds. Repeat 2 times. Complete this exercise 3 times per week. Exercise C: Supination, Active  Stand or sit with your arms at your sides. Bend your left / right elbow to an "L" shape (90 degrees). Turn your palm upward until you feel a gentle stretch on the inside of your forearm. Hold this position for 30 seconds. Slowly return your palm to the starting position. Repeat 2 times. Complete this exercise 3 times per week. Exercise D: Pronation, Active  Stand or sit  with your arms at your sides. Bend your left / right elbow to an "L" shape (90 degrees). Turn your palm downward until you feel a gentle stretch on the top of your forearm. Hold this position for 30 seconds. Slowly return your palm to the starting position. Repeat 2 times. Complete this exercise once a day.  STRETCHING EXERCISES These exercises warm up your muscles and joints and improve the movement and flexibility of your injured wrist and forearm. These exercises also help to relieve pain, numbness, and tingling. These exercises are done using your healthy wrist and forearm to help stretch the muscles in your injured wrist and forearm. Exercise E: Wrist Flexion, Passive  Extend your left / right arm in front of you, relax your wrist, and point your fingers downward. Gently push on the back of your hand. Stop when you feel a gentle stretch on the top of your forearm. Hold this position for 30 seconds. Repeat 2 times. Complete this exercise 3 times per week. Exercise F: Wrist Extension, Passive  Extend your left / right arm in front of you and turn your palm upward. Gently pull your palm and fingertips back so your fingers point downward. You should feel a gentle stretch on the palm-side of your forearm. Hold this position for 30 seconds. Repeat 2 times. Complete this exercise 3 times per week. Exercise G: Forearm Rotation, Supination, Passive Sit with your left / right elbow bent to an "L" shape (90 degrees) with your forearm  resting on a table. Keeping your upper body and shoulder still, use your other hand to rotate your forearm palm-up until you feel a gentle to moderate stretch. Hold this position for 30 seconds. Slowly release the stretch and return to the starting position. Repeat 2 times. Complete this exercise 3 times per week. Exercise H: Forearm Rotation, Pronation, Passive Sit with your left / right elbow bent to an "L" shape (90 degrees) with your forearm resting on a  table. Keeping your upper body and shoulder still, use your other hand to rotate your forearm palm-down until you feel a gentle to moderate stretch. Hold this position for 30 seconds. Slowly release the stretch and return to the starting position. Repeat 2 times. Complete this exercise 3 times per week.  STRENGTHENING EXERCISES These exercises build strength and endurance in your wrist and forearm. Endurance is the ability to use your muscles for a long time, even after they get tired. Exercise I: Wrist Flexors  Sit with your left / right forearm supported on a table and your hand resting palm-up over the edge of the table. Your elbow should be bent to an "L" shape (about 90 degrees) and be below the level of your shoulder. Hold a 3-5 lb weight in your left / right hand. Or, hold a rubber exercise band or tube in both hands, keeping your hands at the same level and hip distance apart. There should be a slight tension in the exercise band or tube. Slowly curl your hand up toward your forearm. Hold this position for 3 seconds. Slowly lower your hand back to the starting position. Repeat 2 times. Complete this exercise 3 times per week. Exercise J: Wrist Extensors  Sit with your left / right forearm supported on a table and your hand resting palm-down over the edge of the table. Your elbow should be bent to an "L" shape (about 90 degrees) and be below the level of your shoulder. Hold a 3-5 lb weight in your left / right hand. Or, hold a rubber exercise band or tube in both hands, keeping your hands at the same level and hip distance apart. There should be a slight tension in the exercise band or tube. Slowly curl your hand up toward your forearm. Hold this position for 3 seconds. Slowly lower your hand back to the starting position. Repeat 2 times. Complete this exercise 3 times per week. Exercise K: Forearm Rotation, Supination  Sit with your left / right forearm supported on a table and your  hand resting palm-down. Your elbow should be at your side, bent to an "L" shape (about 90 degrees), and below the level of your shoulder. Keep your wrist stable and in a neutral position throughout the exercise. Gently hold a lightweight hammer with your left / right hand. Without moving your elbow or wrist, slowly rotate your palm upward to a thumbs-up position. Hold this position for 3 seconds. Slowly return your forearm to the starting position. Repeat 2 times. Complete this exercise 3 times per week. Exercise L: Forearm Rotation, Pronation  Sit with your left / right forearm supported on a table and your hand resting palm-up. Your elbow should be at your side, bent to an "L" shape (about 90 degrees), and below the level of your shoulder. Keep your wrist stable. Do not allow it to move backward or forward during the exercise. Gently hold a lightweight hammer with your left / right hand. Without moving your elbow or wrist, slowly rotate your  palm and hand upward to a thumbs-up position. Hold this position for 3 seconds. Slowly return your forearm to the starting position. Repeat 2 times. Complete this exercise 3 times per week. Exercise M: Grip Strengthening  Hold one of these items in your left / right hand: play dough, therapy putty, a dense sponge, a stress ball, or a large, rolled sock. Squeeze as hard as you can without increasing pain. Hold this position for 5 seconds. Slowly release your grip. Repeat 2 times. Complete this exercise 3 times per week.  This information is not intended to replace advice given to you by your health care provider. Make sure you discuss any questions you have with your health care provider. Document Released: 08/10/2005 Document Revised: 06/20/2016 Document Reviewed: 06/21/2015 Elsevier Interactive Patient Education  [employer].

## 2023-06-21 ENCOUNTER — Ambulatory Visit (INDEPENDENT_AMBULATORY_CARE_PROVIDER_SITE_OTHER): Payer: BC Managed Care – PPO | Admitting: Sports Medicine

## 2023-06-21 ENCOUNTER — Encounter: Payer: Self-pay | Admitting: Sports Medicine

## 2023-06-21 VITALS — BP 126/80 | Ht 74.0 in | Wt 243.0 lb

## 2023-06-21 DIAGNOSIS — M2242 Chondromalacia patellae, left knee: Secondary | ICD-10-CM | POA: Diagnosis not present

## 2023-06-21 NOTE — Progress Notes (Signed)
   Subjective:    Patient ID: Justin Andrade, male    DOB: [birth year], 37 y.o.   MRN: [id number]  HPI chief complaint: Left knee pain  Justin Andrade is a pleasant 37 year old male that presents today complaining of approximately 2 months of anterior knee pain.  Pain began initially after running on a treadmill.  Since then, he has had pain primarily with going up stairs or up an incline.  He also experiences pain with knee flexion.  He has not noticed any swelling.  No mechanical symptoms.  No previous injuries to the knee.  No prior surgeries.  He has tried ibuprofen and icing with minimal improvement.  Past medical history reviewed Medications reviewed Allergies reviewed   Review of Systems As above    Objective:   Physical Exam  Well-developed, well-nourished.  No acute distress  Left knee: Full range of motion.  No effusion.  Positive J sign.  No tenderness to palpation along the patellar tendon.  There is some slight discomfort with patellar grind.  Knee is stable to valgus and varus stressing.  Negative anterior drawer, negative posterior drawer.  No tenderness along medial or lateral joint lines.  Negative McMurray's.  Negative Thessaly's.  Neurovascularly intact distally.      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  He will avoid any sort of exercise that involves squatting.  He will give today's treatment approximately 4 weeks.  If no improvement, he will return to the office and we will consider x-rays and formal physical therapy at that time.  Otherwise, follow-up as needed.  This note was dictated using Dragon naturally speaking software and may contain errors in syntax, spelling, or content which have not been identified prior to signing this note.

## 2023-06-28 ENCOUNTER — Ambulatory Visit: Payer: BC Managed Care – PPO | Admitting: Family Medicine

## 2024-04-03 ENCOUNTER — Ambulatory Visit (INDEPENDENT_AMBULATORY_CARE_PROVIDER_SITE_OTHER): Admitting: Family Medicine

## 2024-04-03 ENCOUNTER — Encounter: Payer: Self-pay | Admitting: Family Medicine

## 2024-04-03 VITALS — BP 124/78 | HR 69 | Temp 98.0°F | Resp 16 | Ht 74.0 in | Wt 252.8 lb

## 2024-04-03 DIAGNOSIS — M79671 Pain in right foot: Secondary | ICD-10-CM | POA: Diagnosis not present

## 2024-04-03 MED ORDER — MELOXICAM 15 MG PO TABS: 15.0000 mg | ORAL_TABLET | Freq: Every day | ORAL | 0 refills | Status: AC

## 2024-04-03 MED ORDER — CYCLOBENZAPRINE HCL 10 MG PO TABS: 5.0000 mg | ORAL_TABLET | Freq: Three times a day (TID) | ORAL | 0 refills | Status: AC | PRN

## 2024-04-03 NOTE — Progress Notes (Signed)
 Musculoskeletal Exam  Patient: Justin Andrade DOB: 1986-09-21  DOS: 04/03/2024  SUBJECTIVE:  Chief Complaint:   Chief Complaint  Patient presents with   Pain    Right Foot Pain    Justin Andrade is a 38 y.o.  male for evaluation and treatment of R foot pain.   Onset:  1 week ago. No inj or change in activity.  Location: mid foot Character:  aching  Progression of issue:  is worsening as he is on his feet Associated symptoms: pain relieved with dorsiflexion, worsened in neutral No swelling, bruising, redness Treatment: to date has been rest, ibuprofen, compression, and ice.   Neurovascular symptoms: no  Past Medical History:  Diagnosis Date   Anxiety    Myofascial pain     Objective: VITAL SIGNS: BP 124/78 (BP Location: Left Arm, Patient Position: Sitting)   Pulse 69   Temp 98 F (36.7 C) (Oral)   Resp 16   Ht 6' 2 (1.88 m)   Wt 252 lb 12.8 oz (114.7 kg)   SpO2 99%   BMI 32.46 kg/m  Constitutional: Well formed, well developed. No acute distress. Thorax & Lungs: No accessory muscle use Musculoskeletal: R foot.   Tenderness to palpation: yes over the lateral toe extensors and lateral metatarsal head Deformity: no Ecchymosis: no Seems to be pronation of feet.  Neurologic: Normal sensory function. No focal deficits noted.  Psychiatric: Normal mood. Age appropriate judgment and insight. Alert & oriented x 3.    Assessment:  Right foot pain - Plan: cyclobenzaprine  (FLEXERIL ) 10 MG tablet, meloxicam  (MOBIC ) 15 MG tablet  Plan: Stretches/exercises, heat, ice, Tylenol . Arch support. Mobic  prn. Flexeril  nightly prn. Warned about this. Consider podiatry referral if no improvement in next 3-4 weeks.  F/u as originally scheduled. The patient voiced understanding and agreement to the plan.   Mabel [place], DO 04/03/24  12:11 PM

## 2024-04-03 NOTE — Patient Instructions (Addendum)
 Ice/cold pack over area for 10-15 min twice daily.  Heat (pad or rice pillow in microwave) over affected area, 10-15 minutes twice daily.   OK to take Tylenol  1000 mg (2 extra strength tabs) or 975 mg (3 regular strength tabs) every 6 hours as needed.  Consider Powerstep insoles. There are very quality over the counter inserts. Shop around online and in stores. Dr. Heriberto is a cheaper alternative, though is not as high of quality.   Send me a message in 3-4 weeks if no improvement.   Foods that may reduce pain: 1) Ginger 2) Blueberries 3) Salmon 4) Pumpkin seeds 5) Dark chocolate 6) Turmeric 7) Tart cherries 8) Virgin olive oil 9) Chili peppers 10) Mint 11) Krill oil  Let us  know if you need anything.  Ankle Exercises It is normal to feel mild stretching, pulling, tightness, or discomfort as you do these exercises, but you should stop right away if you feel sudden pain or your pain gets worse.  Stretching and range of motion exercises These exercises warm up your muscles and joints and improve the movement and flexibility of your ankle. These exercises also help to relieve pain, numbness, and tingling. Exercise A: Dorsiflexion/Plantar Flexion    Sit with your affected knee straight or bent. Do not rest your foot on anything. Flex your affected ankle to tilt the top of your foot toward your shin. Hold this position for 5 seconds. Point your toes downward to tilt the top of your foot away from your shin. Hold this position for 5 seconds. Repeat 2 times. Complete this exercise 3 times per week. Exercise B: Ankle Alphabet    Sit with your affected foot supported at your lower leg. Do not rest your foot on anything. Make sure your foot has room to move freely. Think of your affected foot as a paintbrush, and move your foot to trace each letter of the alphabet in the air. Keep your hip and knee still while you trace. Make the letters as large as you can without increasing any  discomfort. Trace every letter from A to Z. Repeat 2 times. Complete this exercise 3 times per week. Strengthening exercises These exercises build strength and endurance in your ankle. Endurance is the ability to use your muscles for a long time, even after they get tired. Exercise D: Dorsiflexors    Secure a rubber exercise band or tube to an object, such as a table leg, that will stay still when the band is pulled. Secure the other end around your affected foot. Sit on the floor, facing the object with your affected leg extended. The band or tube should be slightly tense when your foot is relaxed. Slowly flex your affected ankle and toes to bring your foot toward you. Hold this position for 3 seconds.  Slowly return your foot to the starting position, controlling the band as you do that. Do a total of 10 repetitions. Repeat 2 times. Complete this exercise 3 times per week. Exercise E: Plantar Flexors    Sit on the floor with your affected leg extended. Loop a rubber exercise band or tube around the ball of your affected foot. The ball of your foot is on the walking surface, right under your toes. The band or tube should be slightly tense when your foot is relaxed. Slowly point your toes downward, pushing them away from you. Hold this position for 3 seconds. Slowly release the tension in the band or tube, controlling smoothly until your foot  is back in the starting position. Repeat for a total of 10 repetitions. Repeat 2 times. Complete this exercise 3 times per week. Exercise F: Towel Curls    Sit in a chair on a non-carpeted surface, and put your feet on the floor. Place a towel in front of your feet.  Keeping your heel on the floor, put your affected foot on the towel. Pull the towel toward you by grabbing the towel with your toes and curling them under. Keep your heel on the floor. Let your toes relax. Grab the towel again. Keep going until the towel is completely underneath your  foot. Repeat for a total of 10 repetitions. Repeat 2 times. Complete this exercise 3 times per week. Exercise G: Heel Raise ( Plantar Flexors, Standing)     Stand with your feet shoulder-width apart. Keep your weight spread evenly over the width of your feet while you rise up on your toes. Use a wall or table to steady yourself, but try not to use it for support. If this exercise is too easy, try these options: Shift your weight toward your affected leg until you feel challenged. If told by your health care provider, lift your uninjured leg off the floor. Hold this position for 3 seconds. Repeat for a total of 10 repetitions. Repeat 2 times. Complete this exercise 3 times per week. Exercise H: Tandem Walking Stand with one foot directly in front of the other. Slowly raise your back foot up, lifting your heel before your toes, and place it directly in front of your other foot. Continue to walk in this heel-to-toe way for 10 steps or for as long as told by your health care provider. Have a countertop or wall nearby to use if needed to keep your balance, but try not to hold onto anything for support. Repeat 2 times. Complete this exercises 3 times per week. Make sure you discuss any questions you have with your health care provider. Document Released: 08/10/2005 Document Revised: 05/26/2016 Document Reviewed: 06/14/2015 Elsevier Interactive Patient Education  2018 [employer].

## 2024-04-30 ENCOUNTER — Other Ambulatory Visit: Payer: Self-pay | Admitting: Family Medicine

## 2024-04-30 DIAGNOSIS — M79671 Pain in right foot: Secondary | ICD-10-CM

## 2024-06-24 ENCOUNTER — Ambulatory Visit (INDEPENDENT_AMBULATORY_CARE_PROVIDER_SITE_OTHER): Admitting: Family Medicine

## 2024-06-24 ENCOUNTER — Encounter: Payer: Self-pay | Admitting: Family Medicine

## 2024-06-24 VITALS — BP 130/80 | HR 81 | Temp 98.0°F | Resp 16 | Ht 74.0 in | Wt 251.2 lb

## 2024-06-24 DIAGNOSIS — R519 Headache, unspecified: Secondary | ICD-10-CM | POA: Diagnosis not present

## 2024-06-24 NOTE — Patient Instructions (Addendum)
 Heat (pad or rice pillow in microwave) over affected area, 10-15 minutes twice daily.   Ice/cold pack over area for 10-15 min twice daily.  Consider magnesium 200-400 mg daily. I expect this to work sooner than the stretches.   Claritin (loratadine), Allegra (fexofenadine), Zyrtec (cetirizine) which is also equivalent to Xyzal (levocetirizine); these are listed in order from weakest to strongest. Generic, and therefore cheaper, options are in the parentheses.   Flonase (fluticasone); nasal spray that is over the counter. 2 sprays each nostril, once daily. Aim towards the same side eye when you spray.  There are available OTC, and the generic versions, which may be cheaper, are in parentheses. Show this to a pharmacist if you have trouble finding any of these items.  Let us  know if you need anything.  EXERCISES RANGE OF MOTION (ROM) AND STRETCHING EXERCISES  These exercises may help you when beginning to rehabilitate your issue. In order to successfully resolve your symptoms, you must improve your posture. These exercises are designed to help reduce the forward-head and rounded-shoulder posture which contributes to this condition. Your symptoms may resolve with or without further involvement from your physician, physical therapist or athletic trainer. While completing these exercises, remember:  Restoring tissue flexibility helps normal motion to return to the joints. This allows healthier, less painful movement and activity. An effective stretch should be held for at least 20 seconds, although you may need to begin with shorter hold times for comfort. A stretch should never be painful. You should only feel a gentle lengthening or release in the stretched tissue. Do not do any stretch or exercise that you cannot tolerate.  STRETCH- Axial Extensors Lie on your back on the floor. You may bend your knees for comfort. Place a rolled-up hand towel or dish towel, about 2 inches in diameter, under the  part of your head that makes contact with the floor. Gently tuck your chin, as if trying to make a double chin, until you feel a gentle stretch at the base of your head. Hold 15-20 seconds. Repeat 2-3 times. Complete this exercise 1 time per day.   STRETCH - Axial Extension  Stand or sit on a firm surface. Assume a good posture: chest up, shoulders drawn back, abdominal muscles slightly tense, knees unlocked (if standing) and feet hip width apart. Slowly retract your chin so your head slides back and your chin slightly lowers. Continue to look straight ahead. You should feel a gentle stretch in the back of your head. Be certain not to feel an aggressive stretch since this can cause headaches later. Hold for 15-20 seconds. Repeat 2-3 times. Complete this exercise 1 time per day.  STRETCH - Cervical Side Bend  Stand or sit on a firm surface. Assume a good posture: chest up, shoulders drawn back, abdominal muscles slightly tense, knees unlocked (if standing) and feet hip width apart. Without letting your nose or shoulders move, slowly tip your right / left ear to your shoulder until your feel a gentle stretch in the muscles on the opposite side of your neck. Hold 15-20 seconds. Repeat 2-3 times. Complete this exercise 1-2 times per day.  STRETCH - Cervical Rotators  Stand or sit on a firm surface. Assume a good posture: chest up, shoulders drawn back, abdominal muscles slightly tense, knees unlocked (if standing) and feet hip width apart. Keeping your eyes level with the ground, slowly turn your head until you feel a gentle stretch along the back and opposite side of your  neck. Hold 15-20 seconds. Repeat 2-3 times. Complete this exercise 1-2 times per day.  RANGE OF MOTION - Neck Circles  Stand or sit on a firm surface. Assume a good posture: chest up, shoulders drawn back, abdominal muscles slightly tense, knees unlocked (if standing) and feet hip width apart. Gently roll your head down and  around from the back of one shoulder to the back of the other. The motion should never be forced or painful. Repeat the motion 10-20 times, or until you feel the neck muscles relax and loosen. Repeat 2-3 times. Complete the exercise 1-2 times per day. STRENGTHENING EXERCISES - Cervical Strain and Sprain These exercises may help you when beginning to rehabilitate your injury. They may resolve your symptoms with or without further involvement from your physician, physical therapist, or athletic trainer. While completing these exercises, remember:  Muscles can gain both the endurance and the strength needed for everyday activities through controlled exercises. Complete these exercises as instructed by your physician, physical therapist, or athletic trainer. Progress the resistance and repetitions only as guided. You may experience muscle soreness or fatigue, but the pain or discomfort you are trying to eliminate should never worsen during these exercises. If this pain does worsen, stop and make certain you are following the directions exactly. If the pain is still present after adjustments, discontinue the exercise until you can discuss the trouble with your clinician.  STRENGTH - Cervical Flexors, Isometric Face a wall, standing about 6 inches away. Place a small pillow, a ball about 6-8 inches in diameter, or a folded towel between your forehead and the wall. Slightly tuck your chin and gently push your forehead into the soft object. Push only with mild to moderate intensity, building up tension gradually. Keep your jaw and forehead relaxed. Hold 10 to 20 seconds. Keep your breathing relaxed. Release the tension slowly. Relax your neck muscles completely before you start the next repetition. Repeat 2-3 times. Complete this exercise 1 time per day.  STRENGTH- Cervical Lateral Flexors, Isometric  Stand about 6 inches away from a wall. Place a small pillow, a ball about 6-8 inches in diameter, or a  folded towel between the side of your head and the wall. Slightly tuck your chin and gently tilt your head into the soft object. Push only with mild to moderate intensity, building up tension gradually. Keep your jaw and forehead relaxed. Hold 10 to 20 seconds. Keep your breathing relaxed. Release the tension slowly. Relax your neck muscles completely before you start the next repetition. Repeat 2-3 times. Complete this exercise 1 time per day.  STRENGTH - Cervical Extensors, Isometric  Stand about 6 inches away from a wall. Place a small pillow, a ball about 6-8 inches in diameter, or a folded towel between the back of your head and the wall. Slightly tuck your chin and gently tilt your head back into the soft object. Push only with mild to moderate intensity, building up tension gradually. Keep your jaw and forehead relaxed. Hold 10 to 20 seconds. Keep your breathing relaxed. Release the tension slowly. Relax your neck muscles completely before you start the next repetition. Repeat 2-3 times. Complete this exercise 1 time per day.  POSTURE AND BODY MECHANICS CONSIDERATIONS Keeping correct posture when sitting, standing or completing your activities will reduce the stress put on different body tissues, allowing injured tissues a chance to heal and limiting painful experiences. The following are general guidelines for improved posture. Your physician or physical therapist will provide you  with any instructions specific to your needs. While reading these guidelines, remember: The exercises prescribed by your provider will help you have the flexibility and strength to maintain correct postures. The correct posture provides the optimal environment for your joints to work. All of your joints have less wear and tear when properly supported by a spine with good posture. This means you will experience a healthier, less painful body. Correct posture must be practiced with all of your activities, especially  prolonged sitting and standing. Correct posture is as important when doing repetitive low-stress activities (typing) as it is when doing a single heavy-load activity (lifting).  PROLONGED STANDING WHILE SLIGHTLY LEANING FORWARD When completing a task that requires you to lean forward while standing in one place for a long time, place either foot up on a stationary 2- to 4-inch high object to help maintain the best posture. When both feet are on the ground, the low back tends to lose its slight inward curve. If this curve flattens (or becomes too large), then the back and your other joints will experience too much stress, fatigue more quickly, and can cause pain.   RESTING POSITIONS Consider which positions are most painful for you when choosing a resting position. If you have pain with flexion-based activities (sitting, bending, stooping, squatting), choose a position that allows you to rest in a less flexed posture. You would want to avoid curling into a fetal position on your side. If your pain worsens with extension-based activities (prolonged standing, working overhead), avoid resting in an extended position such as sleeping on your stomach. Most people will find more comfort when they rest with their spine in a more neutral position, neither too rounded nor too arched. Lying on a non-sagging bed on your side with a pillow between your knees, or on your back with a pillow under your knees will often provide some relief. Keep in mind, being in any one position for a prolonged period of time, no matter how correct your posture, can still lead to stiffness.  WALKING Walk with an upright posture. Your ears, shoulders, and hips should all line up. OFFICE WORK When working at a desk, create an environment that supports good, upright posture. Without extra support, muscles fatigue and lead to excessive strain on joints and other tissues.  CHAIR: A chair should be able to slide under your desk when your  back makes contact with the back of the chair. This allows you to work closely. The chair's height should allow your eyes to be level with the upper part of your monitor and your hands to be slightly lower than your elbows. Body position: Your feet should make contact with the floor. If this is not possible, use a foot rest. Keep your ears over your shoulders. This will reduce stress on your neck and low back.

## 2024-06-24 NOTE — Progress Notes (Signed)
 Chief Complaint  Patient presents with   Headache    Headaches     Justin Andrade is a 38 y.o. male here for evaluation of bilateral headache.  Treatment: ibuprofen Aura: none Palliation: ibuprofen Provocation: nothing obvious Associated symptoms: sometimes neck pain Denies: Gum chewing, jaw pain, injury, nausea, vomiting, diplopia, vertigo, ataxia Currently with headache? Yes Failed therapies: No prescribed medications have been used.  BP 130/80 (BP Location: Left Arm, Patient Position: Sitting)   Pulse 81   Temp 98 F (36.7 C) (Oral)   Resp 16   Ht 6' 2 (1.88 m)   Wt 251 lb 3.2 oz (113.9 kg)   SpO2 98%   BMI 32.25 kg/m  General: awake, alert, appearing stated age Eyes: PERRLA, EOMi Nose: Nares patent, no rhinorrhea Lungs: no accessory muscle use Neuro: CN 2-12 intact, no cerebellar signs, DTR's equal and symmetry, no clonus, 5/5 strength throughout, normal gait MSK: slight discomfort over the suboccipital triangle bilaterally to palpation, no TTP over paraspinal cervical musculature or midline, no TTP over the TMJ Psych: Age appropriate judgment and insight, mood and affect normal  Chronic daily headache  Magnesium 200-400 mg daily, stretches and exercises to treat the suboccipital triangle, could consider physical therapy.  Heat, ice, Tylenol .  Ibuprofen as needed.  Hopefully the combination of rehab and magnesium decreases the necessity for this.  Could consider oral medication for chronic daily headache if no improvement over the next month or so.  No red flag signs or symptoms today. The patient voiced understanding and agreement to the plan.  Mabel Mt [place], [place] 11:56 AM 06/24/24

## 2024-10-03 ENCOUNTER — Emergency Department
Admission: EM | Admit: 2024-10-03 | Discharge: 2024-10-03 | Disposition: A | Discharge status: Home / Self Care | Attending: Emergency Medicine | Admitting: Emergency Medicine

## 2024-10-03 ENCOUNTER — Other Ambulatory Visit: Payer: Self-pay

## 2024-10-03 ENCOUNTER — Emergency Department

## 2024-10-03 DIAGNOSIS — K921 Melena: Secondary | ICD-10-CM | POA: Diagnosis present

## 2024-10-03 DIAGNOSIS — K922 Gastrointestinal hemorrhage, unspecified: Secondary | ICD-10-CM | POA: Insufficient documentation

## 2024-10-03 LAB — COMPREHENSIVE METABOLIC PANEL WITH GFR
ALT: 52 U/L — ABNORMAL HIGH (ref 0–44)
AST: 30 U/L (ref 15–41)
Albumin: 4.8 g/dL (ref 3.5–5.0)
Alkaline Phosphatase: 84 U/L (ref 38–126)
Anion gap: 12 (ref 5–15)
BUN: 22 mg/dL — ABNORMAL HIGH (ref 6–20)
CO2: 22 mmol/L (ref 22–32)
Calcium: 10.1 mg/dL (ref 8.9–10.3)
Chloride: 104 mmol/L (ref 98–111)
Creatinine, Ser: 0.83 mg/dL (ref 0.61–1.24)
GFR, Estimated: >60 mL/min
Glucose, Bld: 103 mg/dL — ABNORMAL HIGH (ref 70–99)
Potassium: 4.2 mmol/L (ref 3.5–5.1)
Sodium: 138 mmol/L (ref 135–145)
Total Bilirubin: 1.1 mg/dL (ref 0.0–1.2)
Total Protein: 7.7 g/dL (ref 6.5–8.1)

## 2024-10-03 LAB — CBC
HCT: 44.8 % (ref 39.0–52.0)
Hemoglobin: 15.9 g/dL (ref 13.0–17.0)
MCH: 30.6 pg (ref 26.0–34.0)
MCHC: 35.5 g/dL (ref 30.0–36.0)
MCV: 86.3 fL (ref 80.0–100.0)
Platelets: 232 K/uL (ref 150–400)
RBC: 5.19 MIL/uL (ref 4.22–5.81)
RDW: 12.1 % (ref 11.5–15.5)
WBC: 10.7 K/uL — ABNORMAL HIGH (ref 4.0–10.5)
nRBC: 0 % (ref 0.0–0.2)

## 2024-10-03 LAB — TYPE AND SCREEN
ABO/RH(D): O POS
Antibody Screen: NEGATIVE

## 2024-10-03 MED ORDER — TRANEXAMIC ACID-NACL 1000-0.7 MG/100ML-% IV SOLN
1000.0000 mg | INTRAVENOUS | Status: AC
  Administered 2024-10-03: 1000 mg via INTRAVENOUS
  Filled 2024-10-03: qty 100

## 2024-10-03 MED ORDER — IOHEXOL 350 MG/ML SOLN
100.0000 mL | Freq: Once | INTRAVENOUS | Status: AC | PRN
  Administered 2024-10-03: 100 mL via INTRAVENOUS

## 2024-10-03 MED ORDER — AZITHROMYCIN 250 MG PO TABS: ORAL_TABLET | ORAL | 0 refills | Status: AC

## 2024-10-03 NOTE — ED Provider Notes (Signed)
 CT angio appears reassuring, findings of colitis which would explain rectal bleeding.  Overall well-appearing in no acute distress, hemoglobin is quite stable.  No indication for admission at this time.  Appropriate for discharge, discussed with him colitis is typically self-limited, will provide prescription if symptoms continue greater than 48 hours   Arlander [place], MD 10/03/24 [phone]

## 2024-10-03 NOTE — ED Triage Notes (Signed)
 Pt reports rectal bleeding started last night. +abd pain

## 2024-10-03 NOTE — ED Notes (Signed)
 Pt given DC instructions. Pt verbalized understanding of medication and follow up care. Pt ambulatory from ED without difficulty.

## 2024-10-03 NOTE — ED Provider Notes (Signed)
 "  [facility] Provider Note    Event Date/Time   First MD Initiated Contact with Patient 10/03/24 [phone]     (approximate)   History   [place]   HPI Justin Andrade is a 38 y.o. male with no chronic medical issues including no prior GI bleed history.  He presents tonight for acute onset bright red blood per rectum over multiple episodes.  He had a normal day and evening at his in-laws.  At some point he needed to use the bathroom but had to hold it for an extended period of time.  The urgency became greater and greater and then on the right home that had stop by a store where he had a large volume stool output.  He is not sure if he had blood at that time but he continued to require to use about there multiple times and then started noticing that there was a lot of blood.  He then was able to get a few hours sleep but then woke up again with bowel urgency and has had multiple more episodes of passing blood and very minimal stool.  He is not having any abdominal pain.  This has never happened before.  He has had no recent trauma including rectal trauma.  He does not take any blood thinners.  He has been taking a lot of ibuprofen recently but his stool is not black and tarry and he has no upper abdominal pain.     Physical Exam   Triage Vital Signs: ED Triage Vitals  Encounter Vitals Group     BP 10/03/24 0534 (!) 142/97     Girls Systolic BP Percentile --      Girls Diastolic BP Percentile --      Boys Systolic BP Percentile --      Boys Diastolic BP Percentile --      Pulse Rate 10/03/24 0533 73     Resp 10/03/24 0534 16     Temp 10/03/24 0533 98.3 F (36.8 C)     Temp src --      SpO2 10/03/24 0534 95 %     Weight 10/03/24 0534 113.4 kg (250 lb)     Height 10/03/24 0534 1.88 m (6' 2)     Head Circumference --      Peak Flow --      Pain Score 10/03/24 0533 7     Pain Loc --      Pain Education --      Exclude from Growth Chart --     Most recent vital  signs: Vitals:   10/03/24 0533 10/03/24 0534  BP:  (!) 142/97  Pulse: 73   Resp:  16  Temp: 98.3 F (36.8 C)   SpO2:  95%    General: Awake, no distress.  Appears uncomfortable but he says this is due to the situation and denies any pain. CV:  Good peripheral perfusion.  Resp:  Normal effort. Speaking easily and comfortably, no accessory muscle usage nor intercostal retractions.   Abd:  No distention.  No tenderness to palpation of the abdomen. Other:  Rectal exam notable for minimal rectal vault contents but what is there is bright red blood, Hemoccult positive, quality control passed   ED Results / Procedures / Treatments   Labs (all labs ordered are listed, but only abnormal results are displayed) Labs Reviewed  CBC - Abnormal; Notable for the following components:      Result Value   WBC  10.7 (*)    All other components within normal limits  COMPREHENSIVE METABOLIC PANEL WITH GFR  POC OCCULT BLOOD, ED  TYPE AND SCREEN      RADIOLOGY CT pending at time of signout   PROCEDURES:  Critical Care performed: No  Procedures    IMPRESSION / MDM / ASSESSMENT AND PLAN / ED COURSE  I reviewed the triage vital signs and the nursing notes.                              Differential diagnosis includes, but is not limited to, internal hemorrhoids, diverticular bleed, AV malformation, neoplasm.  Patient's presentation is most consistent with acute presentation with potential threat to life or bodily function.  Labs/studies ordered: CBC, type and screen, CMP, CTA GI bleed protocol  Interventions/Medications given:  Medications  tranexamic acid  (CYKLOKAPRON ) IVPB 1,000 mg (has no administration in time range)    (Note:  hospital course my include additional interventions and/or labs/studies not listed above.)   Patient is hemodynamically stable and asymptomatic from blood loss.  His hemoglobin is normal although I would not expected to have changed so rapidly in the  setting of acute blood loss.  However it is reassuring he is not tachycardic nor orthostatic.  Given this new issue and the symptoms he is describing, we will proceed with CTA GI bleed protocol.  I ordered TXA 1000 mg IV as it may have benefit and very low risk.  Patient and wife agree with plan.     Clinical Course as of 10/03/24 0731  Thu Oct 03, 2024  0719 Transferring ED care to Dr. Arlander to follow up on labs and scan and reassess for appropriate disposition plan [CF]    Clinical Course User Index [CF] Gordan Huxley, MD     FINAL CLINICAL IMPRESSION(S) / ED DIAGNOSES   Final diagnoses:  Acute lower GI bleeding  Hematochezia     Rx / DC Orders   ED Discharge Orders     None        Note:  This document was prepared using Dragon voice recognition software and may include unintentional dictation errors.   Gordan Huxley, MD 10/03/24 [phone]  "
